# Patient Record
Sex: Female | Born: 1965 | Race: White | Hispanic: No | Marital: Single | Smoking: Never smoker
Health system: Southern US, Community
[De-identification: ages and names within clinical notes are randomized; demographics above are authoritative.]

## PROBLEM LIST (undated history)

## (undated) DIAGNOSIS — I10 Essential (primary) hypertension: Secondary | ICD-10-CM

## (undated) DIAGNOSIS — M810 Age-related osteoporosis without current pathological fracture: Secondary | ICD-10-CM

## (undated) DIAGNOSIS — N83209 Unspecified ovarian cyst, unspecified side: Secondary | ICD-10-CM

## (undated) DIAGNOSIS — F419 Anxiety disorder, unspecified: Secondary | ICD-10-CM

## (undated) DIAGNOSIS — G43909 Migraine, unspecified, not intractable, without status migrainosus: Secondary | ICD-10-CM

## (undated) DIAGNOSIS — F32A Depression, unspecified: Secondary | ICD-10-CM

## (undated) DIAGNOSIS — M1990 Unspecified osteoarthritis, unspecified site: Secondary | ICD-10-CM

## (undated) DIAGNOSIS — K449 Diaphragmatic hernia without obstruction or gangrene: Secondary | ICD-10-CM

## (undated) DIAGNOSIS — I340 Nonrheumatic mitral (valve) insufficiency: Secondary | ICD-10-CM

## (undated) DIAGNOSIS — R112 Nausea with vomiting, unspecified: Secondary | ICD-10-CM

## (undated) DIAGNOSIS — F329 Major depressive disorder, single episode, unspecified: Secondary | ICD-10-CM

## (undated) HISTORY — DX: Age-related osteoporosis without current pathological fracture: M81.0

## (undated) HISTORY — DX: Unspecified ovarian cyst, unspecified side: N83.209

## (undated) HISTORY — PX: COLONOSCOPY: SHX174

## (undated) HISTORY — PX: BUNIONECTOMY: SHX129

## (undated) HISTORY — DX: Anxiety disorder, unspecified: F41.9

## (undated) HISTORY — PX: KNEE ARTHROCENTESIS: SUR44

## (undated) HISTORY — DX: Nonrheumatic mitral (valve) insufficiency: I34.0

## (undated) HISTORY — PX: FOOT SURGERY: SHX648

## (undated) HISTORY — PX: ESOPHAGOGASTRODUODENOSCOPY: SHX1529

## (undated) HISTORY — DX: Diaphragmatic hernia without obstruction or gangrene: K44.9

---

## 1997-10-15 ENCOUNTER — Encounter (HOSPITAL_COMMUNITY): Admission: RE | Admit: 1997-10-15 | Discharge: 1998-01-13 | Payer: Self-pay | Admitting: *Deleted

## 2000-10-11 ENCOUNTER — Encounter: Admission: RE | Admit: 2000-10-11 | Discharge: 2000-10-11 | Payer: Self-pay | Admitting: Family Medicine

## 2000-10-11 ENCOUNTER — Encounter: Payer: Self-pay | Admitting: Family Medicine

## 2000-10-19 ENCOUNTER — Encounter: Payer: Self-pay | Admitting: Family Medicine

## 2000-10-19 ENCOUNTER — Encounter: Admission: RE | Admit: 2000-10-19 | Discharge: 2000-10-19 | Payer: Self-pay | Admitting: Family Medicine

## 2000-12-04 ENCOUNTER — Other Ambulatory Visit: Admission: RE | Admit: 2000-12-04 | Discharge: 2000-12-04 | Payer: Self-pay | Admitting: Family Medicine

## 2002-03-11 ENCOUNTER — Other Ambulatory Visit: Admission: RE | Admit: 2002-03-11 | Discharge: 2002-03-11 | Payer: Self-pay | Admitting: Family Medicine

## 2003-04-13 ENCOUNTER — Other Ambulatory Visit: Admission: RE | Admit: 2003-04-13 | Discharge: 2003-04-13 | Payer: Self-pay | Admitting: Family Medicine

## 2003-12-07 ENCOUNTER — Emergency Department (HOSPITAL_COMMUNITY): Admission: EM | Admit: 2003-12-07 | Discharge: 2003-12-07 | Payer: Self-pay | Admitting: Emergency Medicine

## 2004-09-13 ENCOUNTER — Other Ambulatory Visit: Admission: RE | Admit: 2004-09-13 | Discharge: 2004-09-13 | Payer: Self-pay | Admitting: Family Medicine

## 2006-03-02 ENCOUNTER — Emergency Department (HOSPITAL_COMMUNITY): Admission: EM | Admit: 2006-03-02 | Discharge: 2006-03-02 | Payer: Self-pay | Admitting: *Deleted

## 2006-03-07 ENCOUNTER — Encounter: Admission: RE | Admit: 2006-03-07 | Discharge: 2006-03-07 | Payer: Self-pay | Admitting: Family Medicine

## 2007-09-11 ENCOUNTER — Encounter: Admission: RE | Admit: 2007-09-11 | Discharge: 2007-09-11 | Payer: Self-pay | Admitting: Family Medicine

## 2008-02-25 ENCOUNTER — Other Ambulatory Visit: Admission: RE | Admit: 2008-02-25 | Discharge: 2008-02-25 | Payer: Self-pay | Admitting: Family Medicine

## 2008-10-01 ENCOUNTER — Encounter: Admission: RE | Admit: 2008-10-01 | Discharge: 2008-10-01 | Payer: Self-pay | Admitting: Family Medicine

## 2009-03-29 ENCOUNTER — Other Ambulatory Visit: Admission: RE | Admit: 2009-03-29 | Discharge: 2009-03-29 | Payer: Self-pay | Admitting: Family Medicine

## 2009-08-26 ENCOUNTER — Encounter: Admission: RE | Admit: 2009-08-26 | Discharge: 2009-08-26 | Payer: Self-pay | Admitting: Neurology

## 2009-10-05 ENCOUNTER — Encounter: Admission: RE | Admit: 2009-10-05 | Discharge: 2009-10-05 | Payer: Self-pay | Admitting: Family Medicine

## 2010-04-17 ENCOUNTER — Encounter: Payer: Self-pay | Admitting: Family Medicine

## 2010-04-27 ENCOUNTER — Other Ambulatory Visit: Payer: Self-pay | Admitting: Family Medicine

## 2010-04-27 ENCOUNTER — Other Ambulatory Visit (HOSPITAL_COMMUNITY)
Admission: RE | Admit: 2010-04-27 | Discharge: 2010-04-27 | Disposition: A | Discharge status: Home / Self Care | Payer: BC Managed Care – PPO | Source: Ambulatory Visit | Attending: Family Medicine | Admitting: Family Medicine

## 2010-04-27 DIAGNOSIS — Z124 Encounter for screening for malignant neoplasm of cervix: Secondary | ICD-10-CM | POA: Insufficient documentation

## 2010-09-09 ENCOUNTER — Other Ambulatory Visit: Payer: Self-pay | Admitting: Family Medicine

## 2010-09-09 DIAGNOSIS — Z1231 Encounter for screening mammogram for malignant neoplasm of breast: Secondary | ICD-10-CM

## 2010-10-13 ENCOUNTER — Ambulatory Visit
Admission: RE | Admit: 2010-10-13 | Discharge: 2010-10-13 | Disposition: A | Discharge status: Home / Self Care | Payer: BC Managed Care – PPO | Source: Ambulatory Visit | Attending: Family Medicine | Admitting: Family Medicine

## 2010-10-13 DIAGNOSIS — Z1231 Encounter for screening mammogram for malignant neoplasm of breast: Secondary | ICD-10-CM

## 2011-06-27 ENCOUNTER — Other Ambulatory Visit: Payer: Self-pay | Admitting: Family Medicine

## 2011-06-27 ENCOUNTER — Other Ambulatory Visit (HOSPITAL_COMMUNITY)
Admission: RE | Admit: 2011-06-27 | Discharge: 2011-06-27 | Disposition: A | Discharge status: Home / Self Care | Payer: BC Managed Care – PPO | Source: Ambulatory Visit | Attending: Family Medicine | Admitting: Family Medicine

## 2011-06-27 DIAGNOSIS — Z124 Encounter for screening for malignant neoplasm of cervix: Secondary | ICD-10-CM | POA: Insufficient documentation

## 2011-09-18 ENCOUNTER — Other Ambulatory Visit: Payer: Self-pay | Admitting: Family Medicine

## 2011-09-18 DIAGNOSIS — Z1231 Encounter for screening mammogram for malignant neoplasm of breast: Secondary | ICD-10-CM

## 2011-10-24 ENCOUNTER — Ambulatory Visit
Admission: RE | Admit: 2011-10-24 | Discharge: 2011-10-24 | Disposition: A | Discharge status: Home / Self Care | Payer: BC Managed Care – PPO | Source: Ambulatory Visit | Attending: Family Medicine | Admitting: Family Medicine

## 2011-10-24 DIAGNOSIS — Z1231 Encounter for screening mammogram for malignant neoplasm of breast: Secondary | ICD-10-CM

## 2011-10-25 ENCOUNTER — Other Ambulatory Visit: Payer: Self-pay | Admitting: Neurology

## 2011-10-25 DIAGNOSIS — R0989 Other specified symptoms and signs involving the circulatory and respiratory systems: Secondary | ICD-10-CM

## 2011-10-30 ENCOUNTER — Ambulatory Visit
Admission: RE | Admit: 2011-10-30 | Discharge: 2011-10-30 | Disposition: A | Discharge status: Home / Self Care | Payer: BC Managed Care – PPO | Source: Ambulatory Visit | Attending: Neurology | Admitting: Neurology

## 2011-10-30 DIAGNOSIS — R0989 Other specified symptoms and signs involving the circulatory and respiratory systems: Secondary | ICD-10-CM

## 2011-10-30 MED ORDER — GADOBENATE DIMEGLUMINE 529 MG/ML IV SOLN
12.0000 mL | Freq: Once | INTRAVENOUS | Status: AC | PRN
  Administered 2011-10-30: 12 mL via INTRAVENOUS

## 2011-10-30 MED ORDER — GADOBENATE DIMEGLUMINE 529 MG/ML IV SOLN
1.0000 mL | Freq: Once | INTRAVENOUS | Status: AC | PRN
  Administered 2011-10-30: 1 mL via INTRAVENOUS

## 2012-03-06 ENCOUNTER — Other Ambulatory Visit: Payer: Self-pay | Admitting: Dermatology

## 2012-07-11 ENCOUNTER — Other Ambulatory Visit (HOSPITAL_COMMUNITY)
Admission: RE | Admit: 2012-07-11 | Discharge: 2012-07-11 | Disposition: A | Discharge status: Home / Self Care | Payer: BC Managed Care – PPO | Source: Ambulatory Visit | Attending: Family Medicine | Admitting: Family Medicine

## 2012-07-11 ENCOUNTER — Other Ambulatory Visit: Payer: Self-pay | Admitting: Family Medicine

## 2012-07-11 DIAGNOSIS — Z124 Encounter for screening for malignant neoplasm of cervix: Secondary | ICD-10-CM | POA: Insufficient documentation

## 2012-09-20 ENCOUNTER — Other Ambulatory Visit: Payer: Self-pay

## 2012-09-20 DIAGNOSIS — Z1231 Encounter for screening mammogram for malignant neoplasm of breast: Secondary | ICD-10-CM

## 2012-10-28 ENCOUNTER — Ambulatory Visit
Admission: RE | Admit: 2012-10-28 | Discharge: 2012-10-28 | Disposition: A | Discharge status: Home / Self Care | Payer: BC Managed Care – PPO | Source: Ambulatory Visit

## 2012-10-28 DIAGNOSIS — Z1231 Encounter for screening mammogram for malignant neoplasm of breast: Secondary | ICD-10-CM

## 2013-09-15 ENCOUNTER — Other Ambulatory Visit (HOSPITAL_COMMUNITY)
Admission: RE | Admit: 2013-09-15 | Discharge: 2013-09-15 | Disposition: A | Discharge status: Home / Self Care | Payer: BC Managed Care – PPO | Source: Ambulatory Visit | Attending: Family Medicine | Admitting: Family Medicine

## 2013-09-15 ENCOUNTER — Other Ambulatory Visit: Payer: Self-pay | Admitting: Family Medicine

## 2013-09-15 DIAGNOSIS — Z124 Encounter for screening for malignant neoplasm of cervix: Secondary | ICD-10-CM | POA: Insufficient documentation

## 2013-09-16 LAB — CYTOLOGY - PAP

## 2013-09-22 ENCOUNTER — Other Ambulatory Visit: Payer: Self-pay

## 2013-09-22 DIAGNOSIS — Z1231 Encounter for screening mammogram for malignant neoplasm of breast: Secondary | ICD-10-CM

## 2013-10-29 ENCOUNTER — Ambulatory Visit
Admission: RE | Admit: 2013-10-29 | Discharge: 2013-10-29 | Disposition: A | Discharge status: Home / Self Care | Payer: BC Managed Care – PPO | Source: Ambulatory Visit

## 2013-10-29 DIAGNOSIS — Z1231 Encounter for screening mammogram for malignant neoplasm of breast: Secondary | ICD-10-CM

## 2014-04-07 ENCOUNTER — Other Ambulatory Visit: Payer: Self-pay | Admitting: Orthopedic Surgery

## 2014-05-12 ENCOUNTER — Encounter (HOSPITAL_BASED_OUTPATIENT_CLINIC_OR_DEPARTMENT_OTHER): Payer: Self-pay | Admitting: Emergency Medicine

## 2014-05-12 ENCOUNTER — Emergency Department (HOSPITAL_BASED_OUTPATIENT_CLINIC_OR_DEPARTMENT_OTHER)
Admission: EM | Admit: 2014-05-12 | Discharge: 2014-05-13 | Disposition: A | Discharge status: Home / Self Care | Payer: BC Managed Care – PPO | Attending: Emergency Medicine | Admitting: Emergency Medicine

## 2014-05-12 ENCOUNTER — Emergency Department (HOSPITAL_BASED_OUTPATIENT_CLINIC_OR_DEPARTMENT_OTHER): Payer: BC Managed Care – PPO

## 2014-05-12 DIAGNOSIS — S161XXA Strain of muscle, fascia and tendon at neck level, initial encounter: Secondary | ICD-10-CM | POA: Diagnosis not present

## 2014-05-12 DIAGNOSIS — F329 Major depressive disorder, single episode, unspecified: Secondary | ICD-10-CM | POA: Diagnosis not present

## 2014-05-12 DIAGNOSIS — S199XXA Unspecified injury of neck, initial encounter: Secondary | ICD-10-CM | POA: Diagnosis present

## 2014-05-12 DIAGNOSIS — G43909 Migraine, unspecified, not intractable, without status migrainosus: Secondary | ICD-10-CM | POA: Diagnosis not present

## 2014-05-12 DIAGNOSIS — Y92410 Unspecified street and highway as the place of occurrence of the external cause: Secondary | ICD-10-CM | POA: Diagnosis not present

## 2014-05-12 DIAGNOSIS — Z79899 Other long term (current) drug therapy: Secondary | ICD-10-CM | POA: Diagnosis not present

## 2014-05-12 DIAGNOSIS — Y998 Other external cause status: Secondary | ICD-10-CM | POA: Diagnosis not present

## 2014-05-12 DIAGNOSIS — Y9389 Activity, other specified: Secondary | ICD-10-CM | POA: Diagnosis not present

## 2014-05-12 DIAGNOSIS — I10 Essential (primary) hypertension: Secondary | ICD-10-CM | POA: Insufficient documentation

## 2014-05-12 DIAGNOSIS — M542 Cervicalgia: Secondary | ICD-10-CM

## 2014-05-12 HISTORY — DX: Migraine, unspecified, not intractable, without status migrainosus: G43.909

## 2014-05-12 HISTORY — DX: Depression, unspecified: F32.A

## 2014-05-12 HISTORY — DX: Major depressive disorder, single episode, unspecified: F32.9

## 2014-05-12 HISTORY — DX: Essential (primary) hypertension: I10

## 2014-05-12 MED ORDER — ACETAMINOPHEN 500 MG PO TABS
1000.0000 mg | ORAL_TABLET | Freq: Once | ORAL | Status: AC
  Administered 2014-05-13: 1000 mg via ORAL
  Filled 2014-05-12: qty 2

## 2014-05-12 MED ORDER — METHOCARBAMOL 500 MG PO TABS
1000.0000 mg | ORAL_TABLET | Freq: Once | ORAL | Status: AC
  Administered 2014-05-13: 1000 mg via ORAL
  Filled 2014-05-12: qty 2
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
## 2014-05-13 ENCOUNTER — Encounter (HOSPITAL_BASED_OUTPATIENT_CLINIC_OR_DEPARTMENT_OTHER): Payer: Self-pay | Admitting: Emergency Medicine

## 2014-05-13 DIAGNOSIS — S161XXA Strain of muscle, fascia and tendon at neck level, initial encounter: Secondary | ICD-10-CM | POA: Diagnosis not present

## 2014-05-13 MED ORDER — NAPROXEN 375 MG PO TABS: 375.0000 mg | ORAL_TABLET | Freq: Two times a day (BID) | ORAL | Status: DC

## 2014-05-13 MED ORDER — METHOCARBAMOL 500 MG PO TABS: 500.0000 mg | ORAL_TABLET | Freq: Two times a day (BID) | ORAL | Status: DC

## 2014-05-13 NOTE — Discharge Instructions (Signed)
Cervical Strain and Sprain (Whiplash) with Rehab Cervical strain and sprain are injuries that commonly occur with "whiplash" injuries. Whiplash occurs when the neck is forcefully whipped backward or forward, such as during a motor vehicle accident or during contact sports. The muscles, ligaments, tendons, discs, and nerves of the neck are susceptible to injury when this occurs. RISK FACTORS Risk of having a whiplash injury increases if:  Osteoarthritis of the spine.  Situations that make head or neck accidents or trauma more likely.  High-risk sports (football, rugby, wrestling, hockey, auto racing, gymnastics, diving, contact karate, or boxing).  Poor strength and flexibility of the neck.  Previous neck injury.  Poor tackling technique.  Improperly fitted or padded equipment. SYMPTOMS   Pain or stiffness in the front or back of neck or both.  Symptoms may present immediately or up to 24 hours after injury.  Dizziness, headache, nausea, and vomiting.  Muscle spasm with soreness and stiffness in the neck.  Tenderness and swelling at the injury site. PREVENTION  Learn and use proper technique (avoid tackling with the head, spearing, and head-butting; use proper falling techniques to avoid landing on the head).  Warm up and stretch properly before activity.  Maintain physical fitness:  Strength, flexibility, and endurance.  Cardiovascular fitness.  Wear properly fitted and padded protective equipment, such as padded soft collars, for participation in contact sports. PROGNOSIS  Recovery from cervical strain and sprain injuries is dependent on the extent of the injury. These injuries are usually curable in 1 week to 3 months with appropriate treatment.  RELATED COMPLICATIONS   Temporary numbness and weakness may occur if the nerve roots are damaged, and this may persist until the nerve has completely healed.  Chronic pain due to frequent recurrence of  symptoms.  Prolonged healing, especially if activity is resumed too soon (before complete recovery). TREATMENT  Treatment initially involves the use of ice and medication to help reduce pain and inflammation. It is also important to perform strengthening and stretching exercises and modify activities that worsen symptoms so the injury does not get worse. These exercises may be performed at home or with a therapist. For patients who experience severe symptoms, a soft, padded collar may be recommended to be worn around the neck.  Improving your posture may help reduce symptoms. Posture improvement includes pulling your chin and abdomen in while sitting or standing. If you are sitting, sit in a firm chair with your buttocks against the back of the chair. While sleeping, try replacing your pillow with a small towel rolled to 2 inches in diameter, or use a cervical pillow or soft cervical collar. Poor sleeping positions delay healing.  For patients with nerve root damage, which causes numbness or weakness, the use of a cervical traction apparatus may be recommended. Surgery is rarely necessary for these injuries. However, cervical strain and sprains that are present at birth (congenital) may require surgery. MEDICATION   If pain medication is necessary, nonsteroidal anti-inflammatory medications, such as aspirin and ibuprofen, or other minor pain relievers, such as acetaminophen, are often recommended.  Do not take pain medication for 7 days before surgery.  Prescription pain relievers may be given if deemed necessary by your caregiver. Use only as directed and only as much as you need. HEAT AND COLD:   Cold treatment (icing) relieves pain and reduces inflammation. Cold treatment should be applied for 10 to 15 minutes every 2 to 3 hours for inflammation and pain and immediately after any activity that aggravates   your symptoms. Use ice packs or an ice massage.  Heat treatment may be used prior to  performing the stretching and strengthening activities prescribed by your caregiver, physical therapist, or athletic trainer. Use a heat pack or a warm soak. SEEK MEDICAL CARE IF:   Symptoms get worse or do not improve in 2 weeks despite treatment.  New, unexplained symptoms develop (drugs used in treatment may produce side effects). EXERCISES RANGE OF MOTION (ROM) AND STRETCHING EXERCISES - Cervical Strain and Sprain These exercises may help you when beginning to rehabilitate your injury. In order to successfully resolve your symptoms, you must improve your posture. These exercises are designed to help reduce the forward-head and rounded-shoulder posture which contributes to this condition. Your symptoms may resolve with or without further involvement from your physician, physical therapist or athletic trainer. While completing these exercises, remember:   Restoring tissue flexibility helps normal motion to return to the joints. This allows healthier, less painful movement and activity.  An effective stretch should be held for at least 20 seconds, although you may need to begin with shorter hold times for comfort.  A stretch should never be painful. You should only feel a gentle lengthening or release in the stretched tissue. STRETCH- Axial Extensors  Lie on your back on the floor. You may bend your knees for comfort. Place a rolled-up hand towel or dish towel, about 2 inches in diameter, under the part of your head that makes contact with the floor.  Gently tuck your chin, as if trying to make a "double chin," until you feel a gentle stretch at the base of your head.  Hold __________ seconds. Repeat __________ times. Complete this exercise __________ times per day.  STRETCH - Axial Extension   Stand or sit on a firm surface. Assume a good posture: chest up, shoulders drawn back, abdominal muscles slightly tense, knees unlocked (if standing) and feet hip width apart.  Slowly retract your  chin so your head slides back and your chin slightly lowers. Continue to look straight ahead.  You should feel a gentle stretch in the back of your head. Be certain not to feel an aggressive stretch since this can cause headaches later.  Hold for __________ seconds. Repeat __________ times. Complete this exercise __________ times per day. STRETCH - Cervical Side Bend   Stand or sit on a firm surface. Assume a good posture: chest up, shoulders drawn back, abdominal muscles slightly tense, knees unlocked (if standing) and feet hip width apart.  Without letting your nose or shoulders move, slowly tip your right / left ear to your shoulder until your feel a gentle stretch in the muscles on the opposite side of your neck.  Hold __________ seconds. Repeat __________ times. Complete this exercise __________ times per day. STRETCH - Cervical Rotators   Stand or sit on a firm surface. Assume a good posture: chest up, shoulders drawn back, abdominal muscles slightly tense, knees unlocked (if standing) and feet hip width apart.  Keeping your eyes level with the ground, slowly turn your head until you feel a gentle stretch along the back and opposite side of your neck.  Hold __________ seconds. Repeat __________ times. Complete this exercise __________ times per day. RANGE OF MOTION - Neck Circles   Stand or sit on a firm surface. Assume a good posture: chest up, shoulders drawn back, abdominal muscles slightly tense, knees unlocked (if standing) and feet hip width apart.  Gently roll your head down and around from the   back of one shoulder to the back of the other. The motion should never be forced or painful.  Repeat the motion 10-20 times, or until you feel the neck muscles relax and loosen. Repeat __________ times. Complete the exercise __________ times per day. STRENGTHENING EXERCISES - Cervical Strain and Sprain These exercises may help you when beginning to rehabilitate your injury. They may  resolve your symptoms with or without further involvement from your physician, physical therapist, or athletic trainer. While completing these exercises, remember:   Muscles can gain both the endurance and the strength needed for everyday activities through controlled exercises.  Complete these exercises as instructed by your physician, physical therapist, or athletic trainer. Progress the resistance and repetitions only as guided.  You may experience muscle soreness or fatigue, but the pain or discomfort you are trying to eliminate should never worsen during these exercises. If this pain does worsen, stop and make certain you are following the directions exactly. If the pain is still present after adjustments, discontinue the exercise until you can discuss the trouble with your clinician. STRENGTH - Cervical Flexors, Isometric  Face a wall, standing about 6 inches away. Place a small pillow, a ball about 6-8 inches in diameter, or a folded towel between your forehead and the wall.  Slightly tuck your chin and gently push your forehead into the soft object. Push only with mild to moderate intensity, building up tension gradually. Keep your jaw and forehead relaxed.  Hold 10 to 20 seconds. Keep your breathing relaxed.  Release the tension slowly. Relax your neck muscles completely before you start the next repetition. Repeat __________ times. Complete this exercise __________ times per day. STRENGTH- Cervical Lateral Flexors, Isometric   Stand about 6 inches away from a wall. Place a small pillow, a ball about 6-8 inches in diameter, or a folded towel between the side of your head and the wall.  Slightly tuck your chin and gently tilt your head into the soft object. Push only with mild to moderate intensity, building up tension gradually. Keep your jaw and forehead relaxed.  Hold 10 to 20 seconds. Keep your breathing relaxed.  Release the tension slowly. Relax your neck muscles completely  before you start the next repetition. Repeat __________ times. Complete this exercise __________ times per day. STRENGTH - Cervical Extensors, Isometric   Stand about 6 inches away from a wall. Place a small pillow, a ball about 6-8 inches in diameter, or a folded towel between the back of your head and the wall.  Slightly tuck your chin and gently tilt your head back into the soft object. Push only with mild to moderate intensity, building up tension gradually. Keep your jaw and forehead relaxed.  Hold 10 to 20 seconds. Keep your breathing relaxed.  Release the tension slowly. Relax your neck muscles completely before you start the next repetition. Repeat __________ times. Complete this exercise __________ times per day. POSTURE AND BODY MECHANICS CONSIDERATIONS - Cervical Strain and Sprain Keeping correct posture when sitting, standing or completing your activities will reduce the stress put on different body tissues, allowing injured tissues a chance to heal and limiting painful experiences. The following are general guidelines for improved posture. Your physician or physical therapist will provide you with any instructions specific to your needs. While reading these guidelines, remember:  The exercises prescribed by your provider will help you have the flexibility and strength to maintain correct postures.  The correct posture provides the optimal environment for your joints to   work. All of your joints have less wear and tear when properly supported by a spine with good posture. This means you will experience a healthier, less painful body.  Correct posture must be practiced with all of your activities, especially prolonged sitting and standing. Correct posture is as important when doing repetitive low-stress activities (typing) as it is when doing a single heavy-load activity (lifting). PROLONGED STANDING WHILE SLIGHTLY LEANING FORWARD When completing a task that requires you to lean  forward while standing in one place for a long time, place either foot up on a stationary 2- to 4-inch high object to help maintain the best posture. When both feet are on the ground, the low back tends to lose its slight inward curve. If this curve flattens (or becomes too large), then the back and your other joints will experience too much stress, fatigue more quickly, and can cause pain.  RESTING POSITIONS Consider which positions are most painful for you when choosing a resting position. If you have pain with flexion-based activities (sitting, bending, stooping, squatting), choose a position that allows you to rest in a less flexed posture. You would want to avoid curling into a fetal position on your side. If your pain worsens with extension-based activities (prolonged standing, working overhead), avoid resting in an extended position such as sleeping on your stomach. Most people will find more comfort when they rest with their spine in a more neutral position, neither too rounded nor too arched. Lying on a non-sagging bed on your side with a pillow between your knees, or on your back with a pillow under your knees will often provide some relief. Keep in mind, being in any one position for a prolonged period of time, no matter how correct your posture, can still lead to stiffness. WALKING Walk with an upright posture. Your ears, shoulders, and hips should all line up. OFFICE WORK When working at a desk, create an environment that supports good, upright posture. Without extra support, muscles fatigue and lead to excessive strain on joints and other tissues. CHAIR:  A chair should be able to slide under your desk when your back makes contact with the back of the chair. This allows you to work closely.  The chair's height should allow your eyes to be level with the upper part of your monitor and your hands to be slightly lower than your elbows.  Body position:  Your feet should make contact with the  floor. If this is not possible, use a foot rest.  Keep your ears over your shoulders. This will reduce stress on your neck and low back. Document Released: 03/13/2005 Document Revised: 07/28/2013 Document Reviewed: 06/25/2008 [employer] Patient Information 2015 [employer]. This information is not intended to replace advice given to you by your health care provider. Make sure you discuss any questions you have with your health care provider.  

## 2014-10-01 ENCOUNTER — Other Ambulatory Visit: Payer: Self-pay

## 2014-10-01 DIAGNOSIS — Z1231 Encounter for screening mammogram for malignant neoplasm of breast: Secondary | ICD-10-CM

## 2014-10-15 ENCOUNTER — Other Ambulatory Visit: Payer: Self-pay | Admitting: Obstetrics & Gynecology

## 2014-11-02 ENCOUNTER — Ambulatory Visit
Admission: RE | Admit: 2014-11-02 | Discharge: 2014-11-02 | Disposition: A | Discharge status: Home / Self Care | Payer: BC Managed Care – PPO | Source: Ambulatory Visit

## 2014-11-02 DIAGNOSIS — Z1231 Encounter for screening mammogram for malignant neoplasm of breast: Secondary | ICD-10-CM

## 2015-10-04 ENCOUNTER — Other Ambulatory Visit: Payer: Self-pay | Admitting: Family Medicine

## 2015-10-04 DIAGNOSIS — Z1231 Encounter for screening mammogram for malignant neoplasm of breast: Secondary | ICD-10-CM

## 2015-11-04 ENCOUNTER — Ambulatory Visit
Admission: RE | Admit: 2015-11-04 | Discharge: 2015-11-04 | Disposition: A | Discharge status: Home / Self Care | Payer: BC Managed Care – PPO | Source: Ambulatory Visit | Attending: Family Medicine | Admitting: Family Medicine

## 2015-11-04 DIAGNOSIS — Z1231 Encounter for screening mammogram for malignant neoplasm of breast: Secondary | ICD-10-CM

## 2016-07-10 ENCOUNTER — Other Ambulatory Visit: Payer: Self-pay | Admitting: Neurology

## 2016-07-10 DIAGNOSIS — Z09 Encounter for follow-up examination after completed treatment for conditions other than malignant neoplasm: Secondary | ICD-10-CM

## 2016-07-21 ENCOUNTER — Ambulatory Visit
Admission: RE | Admit: 2016-07-21 | Discharge: 2016-07-21 | Disposition: A | Discharge status: Home / Self Care | Payer: BC Managed Care – PPO | Source: Ambulatory Visit | Attending: Neurology | Admitting: Neurology

## 2016-07-21 DIAGNOSIS — Z09 Encounter for follow-up examination after completed treatment for conditions other than malignant neoplasm: Secondary | ICD-10-CM

## 2016-07-21 MED ORDER — GADOBENATE DIMEGLUMINE 529 MG/ML IV SOLN
15.0000 mL | Freq: Once | INTRAVENOUS | Status: AC | PRN
  Administered 2016-07-21: 14 mL via INTRAVENOUS

## 2016-08-01 ENCOUNTER — Other Ambulatory Visit: Payer: Self-pay | Admitting: Neurology

## 2016-08-01 DIAGNOSIS — I773 Arterial fibromuscular dysplasia: Secondary | ICD-10-CM

## 2016-08-09 ENCOUNTER — Ambulatory Visit
Admission: RE | Admit: 2016-08-09 | Discharge: 2016-08-09 | Disposition: A | Discharge status: Home / Self Care | Payer: BC Managed Care – PPO | Source: Ambulatory Visit | Attending: Neurology | Admitting: Neurology

## 2016-08-09 DIAGNOSIS — I773 Arterial fibromuscular dysplasia: Secondary | ICD-10-CM

## 2016-09-22 ENCOUNTER — Other Ambulatory Visit: Payer: Self-pay | Admitting: Family Medicine

## 2016-09-22 ENCOUNTER — Other Ambulatory Visit: Payer: Self-pay | Admitting: Internal Medicine

## 2016-09-22 DIAGNOSIS — Z1231 Encounter for screening mammogram for malignant neoplasm of breast: Secondary | ICD-10-CM

## 2016-11-06 ENCOUNTER — Ambulatory Visit
Admission: RE | Admit: 2016-11-06 | Discharge: 2016-11-06 | Disposition: A | Discharge status: Home / Self Care | Payer: BC Managed Care – PPO | Source: Ambulatory Visit | Attending: Internal Medicine | Admitting: Internal Medicine

## 2016-11-06 DIAGNOSIS — Z1231 Encounter for screening mammogram for malignant neoplasm of breast: Secondary | ICD-10-CM

## 2017-09-24 ENCOUNTER — Other Ambulatory Visit: Payer: Self-pay | Admitting: Internal Medicine

## 2017-09-24 DIAGNOSIS — Z1231 Encounter for screening mammogram for malignant neoplasm of breast: Secondary | ICD-10-CM

## 2017-11-09 ENCOUNTER — Ambulatory Visit
Admission: RE | Admit: 2017-11-09 | Discharge: 2017-11-09 | Disposition: A | Discharge status: Home / Self Care | Payer: BC Managed Care – PPO | Source: Ambulatory Visit | Attending: Internal Medicine | Admitting: Internal Medicine

## 2017-11-09 DIAGNOSIS — Z1231 Encounter for screening mammogram for malignant neoplasm of breast: Secondary | ICD-10-CM

## 2018-10-28 ENCOUNTER — Other Ambulatory Visit: Payer: Self-pay | Admitting: Internal Medicine

## 2018-10-28 DIAGNOSIS — Z1231 Encounter for screening mammogram for malignant neoplasm of breast: Secondary | ICD-10-CM

## 2018-12-11 ENCOUNTER — Ambulatory Visit
Admission: RE | Admit: 2018-12-11 | Discharge: 2018-12-11 | Disposition: A | Discharge status: Home / Self Care | Payer: BC Managed Care – PPO | Source: Ambulatory Visit | Attending: Internal Medicine | Admitting: Internal Medicine

## 2018-12-11 ENCOUNTER — Other Ambulatory Visit: Payer: Self-pay

## 2018-12-11 DIAGNOSIS — Z1231 Encounter for screening mammogram for malignant neoplasm of breast: Secondary | ICD-10-CM

## 2019-12-16 ENCOUNTER — Other Ambulatory Visit: Payer: Self-pay | Admitting: Internal Medicine

## 2019-12-16 DIAGNOSIS — R011 Cardiac murmur, unspecified: Secondary | ICD-10-CM

## 2019-12-19 ENCOUNTER — Ambulatory Visit: Payer: BC Managed Care – PPO

## 2019-12-19 ENCOUNTER — Other Ambulatory Visit: Payer: Self-pay

## 2019-12-19 DIAGNOSIS — R011 Cardiac murmur, unspecified: Secondary | ICD-10-CM

## 2019-12-24 ENCOUNTER — Other Ambulatory Visit: Payer: Self-pay | Admitting: Internal Medicine

## 2019-12-24 DIAGNOSIS — Z1231 Encounter for screening mammogram for malignant neoplasm of breast: Secondary | ICD-10-CM

## 2020-01-09 ENCOUNTER — Ambulatory Visit: Payer: BC Managed Care – PPO | Admitting: Cardiology

## 2020-01-09 ENCOUNTER — Other Ambulatory Visit: Payer: Self-pay

## 2020-01-09 ENCOUNTER — Encounter: Payer: Self-pay | Admitting: Cardiology

## 2020-01-09 VITALS — BP 167/94 | HR 88 | Ht 66.0 in | Wt 150.0 lb

## 2020-01-09 DIAGNOSIS — I340 Nonrheumatic mitral (valve) insufficiency: Secondary | ICD-10-CM

## 2020-01-09 DIAGNOSIS — R072 Precordial pain: Secondary | ICD-10-CM

## 2020-01-09 DIAGNOSIS — I10 Essential (primary) hypertension: Secondary | ICD-10-CM

## 2020-01-09 DIAGNOSIS — Z712 Person consulting for explanation of examination or test findings: Secondary | ICD-10-CM

## 2020-01-09 NOTE — Patient Instructions (Signed)
Please hold diltiazem 2 days prior to the stress test.

## 2020-01-09 NOTE — Progress Notes (Signed)
Date:  01/09/2020   ID:  Cynthia Roberts, DOB [birth year], MRN [id number]  PCP:  Crist Infante, MD  Cardiologist:  Rex Kras, DO, [facility] (established care 01/09/2020)  REASON FOR CONSULT: Mitral regurgitation and chest pain  REQUESTING PHYSICIAN:  Crist Infante, MD [address] [place],  [place] [address]  Chief Complaint  Patient presents with  . Mitral Regurgitation  . New Patient (Initial Visit)  . Chest Pain    HPI  Cynthia Roberts is a 54 y.o. female who presents to the office with a chief complaint of " evaluation of mitral regurgitation and chest pain." Patient's past medical history and cardiovascular risk factors include: hypertension, depression, nonrheumatic mitral regurgitation.   She is referred to the office at the request of Crist Infante, MD for evaluation of mitral regurgitation and chest pain.  Patient states that she recently went for her yearly physical and was noted to have a cardiac murmur on examination.  She was asked to have an echocardiogram which noted mitral regurgitation history she is referred to the office for further evaluation and management.  Patient states that she is also been having chest pain recently.  Her last episode was yesterday.  Symptoms have been present for the last 1 month.  She describes the discomfort as tightness-like sensation in the substernal region, intensity is 5 out of 10, does not get worse with exercise or with activity and at times does get better with resting.  No improving or worsening factors.  The symptoms are usually self-limited.  She has not taken any aspirin or sublingual nitroglycerin tablets for the discomfort.  Patient states that she has been having dyspnea on exertion which is chronic and stable  No family history of premature coronary disease or sudden cardiac death.  Patient states that her home blood pressures usually range between 021-115 mmHg and diastolic blood pressures range between 70-80 mmHg.    Denies prior history of coronary artery disease, myocardial infarction, congestive heart failure, deep venous thrombosis, pulmonary embolism, stroke, transient ischemic attack.  ALLERGIES: No Known Allergies  MEDICATION LIST PRIOR TO VISIT: Current Meds  Medication Sig  . brexpiprazole (REXULTI) 1 MG TABS tablet Take 1 tablet by mouth daily.  Marland Kitchen diltiazem (TIAZAC) 240 MG 24 hr capsule Take 240 mg by mouth daily.   Marland Kitchen doxazosin (CARDURA) 2 MG tablet Take 1 tablet by mouth daily.  . irbesartan (AVAPRO) 300 MG tablet Take 1 tablet by mouth daily.  Marland Kitchen L-Methylfolate (DEPLIN) 7.5 MG TABS Take 15 mg by mouth daily.   Marland Kitchen lamoTRIgine (LAMICTAL) 150 MG tablet Take 150 mg by mouth 2 (two) times daily.   Marland Kitchen linaclotide (LINZESS) 290 MCG CAPS capsule 1 capsule as needed.  . promethazine (PHENERGAN) 25 MG tablet as needed.  . temazepam (RESTORIL) 30 MG capsule Take 30 mg by mouth at bedtime as needed for sleep.   . traZODone (DESYREL) 150 MG tablet Take by mouth at bedtime.  Marland Kitchen Ubrogepant (UBRELVY) 50 MG TABS 1 tablet as needed.  . vortioxetine HBr (TRINTELLIX) 20 MG TABS tablet Take 1 tablet by mouth daily.  Marland Kitchen zonisamide (ZONEGRAN) 100 MG capsule Take 200 mg by mouth daily.     PAST MEDICAL HISTORY: Past Medical History:  Diagnosis Date  . Depression   . Hypertension   . Migraines     PAST SURGICAL HISTORY: Past Surgical History:  Procedure Laterality Date  . FOOT SURGERY Left   . KNEE ARTHROCENTESIS Left     FAMILY HISTORY: The  patient family history includes Heart disease in her mother; Stroke in her father.  SOCIAL HISTORY:  The patient  reports that she has never smoked. She has never used smokeless tobacco. She reports that she does not drink alcohol and does not use drugs.  REVIEW OF SYSTEMS: Review of Systems  Constitutional: Negative for chills and fever.  HENT: Negative for hoarse voice and nosebleeds.   Eyes: Negative for discharge, double vision and pain.   Cardiovascular: Positive for chest pain and dyspnea on exertion. Negative for claudication, leg swelling, orthopnea, palpitations, paroxysmal nocturnal dyspnea and syncope.  Respiratory: Negative for hemoptysis and shortness of breath.   Musculoskeletal: Negative for muscle cramps and myalgias.  Gastrointestinal: Negative for abdominal pain, constipation, diarrhea, hematemesis, hematochezia, melena, nausea and vomiting.  Neurological: Negative for dizziness and light-headedness.    PHYSICAL EXAM: Vitals with BMI 01/09/2020 01/09/2020 05/13/2014  Height - _0  -  Weight - 150 lbs -  BMI - 89.21 -  Systolic 194 174 081  Diastolic 94 89 64  Pulse 88 92 78   CONSTITUTIONAL: Well-developed and well-nourished. No acute distress.  SKIN: Skin is warm and dry. No rash noted. No cyanosis. No pallor. No jaundice HEAD: Normocephalic and atraumatic.  EYES: No scleral icterus MOUTH/THROAT: Moist oral membranes.  NECK: No JVD present. No thyromegaly noted. No carotid bruits  LYMPHATIC: No visible cervical adenopathy.  CHEST Normal respiratory effort. No intercostal retractions  LUNGS: Clear to auscultation bilaterally.  No stridor. No wheezes. No rales.  CARDIOVASCULAR: Regular rate and rhythm, positive K4-Y1, soft holosystolic murmur heard at the apex, no gallops or rubs ABDOMINAL: No apparent ascites.  EXTREMITIES: No peripheral edema, warm to touch bilaterally.  2+ dorsalis pedis and posterior tibial pulses. HEMATOLOGIC: No significant bruising NEUROLOGIC: Oriented to person, place, and time. Nonfocal. Normal muscle tone.  PSYCHIATRIC: Normal mood and affect. Normal behavior. Cooperative  CARDIAC DATABASE: EKG: 01/09/2020: Normal sinus rhythm, 88 bpm, left axis deviation, poor R wave progression, LVH per voltage criteria, nonspecific T wave abnormality, without underlying injury pattern.  Echocardiogram:  12/19/2019: Left ventricle cavity is normal in size and wall thickness. Normal global  wall motion. Normal LV systolic function with EF 64%. Diastolic function not assessed due to severity of mitral regurgitation.  Calculated EF 64%. Left atrial cavity is mildly dilated. Eccentric, moderate (Grade III) mitral regurgitation. Moderate tricuspid regurgitation. Estimated pulmonary artery systolic pressure 35 mmHg.   Stress Testing: No results found for this or any previous visit from the past 1095 days.  Heart Catheterization: None  LABORATORY DATA: External Labs: Collected: 12/05/2019 Creatinine 0.9 mg/dL. eGFR: 66 mL/min per 1.73 m Lipid profile: Total cholesterol 166, triglycerides 98, HDL 61, LDL 85, non HDL 105 Hemoglobin A1c: 4.9 TSH: 1.49   IMPRESSION:    ICD-10-CM   1. Nonrheumatic mitral valve regurgitation  I34.0 EKG 12-Lead    PCV ECHOCARDIOGRAM COMPLETE  2. Precordial chest pain  R07.2 PCV MYOCARDIAL PERFUSION WO LEXISCAN    SARS-COV-2 RNA,(COVID-19) QUAL NAAT  3. Benign hypertension  I10   4. Encounter to discuss test results  Z71.2      RECOMMENDATIONS: SHAWNTAY PREST is a 54 y.o. female whose past medical history and cardiac risk factors include: hypertension, depression, nonrheumatic mitral regurgitation.   Nonrheumatic mitral valve regurgitation:  Patient has no prior history of rheumatic fever.  Recent echocardiogram reported a markedly grade 3 moderate.  However, I personally reviewed the echocardiogram that was performed in the MR severity appears to be mild to  moderate.  We will repeat the echo in 6 months to reevaluate the severity.  I also educated the patient that improving her systolic blood pressures can help improve the severity of her mitral regurgitation and prolonged need for mitral valve intervention in the future.  Patient stated that she will discuss blood pressure medication titration with her PCP.  Precordial chest pain  Patient symptoms of chest pain have both typical and atypical features.  EKG shows normal sinus rhythm  without underlying ischemia or injury pattern.  Echocardiogram results reviewed with her in great detail at today's visit.  Recommend exercise nuclear stress test given LVH on EKG.  Patient is asked to hold calcium channel blockers 2 days prior to stress test.  She will require Covid screen prior to the study  Benign essential hypertension:  Medications reconciled.  Patient is office blood pressures are not at goal.  And even at home her systolic blood pressures are between 140-150 mm).  I have asked her to keep a log of her blood pressures and to review with her PCP to see if clinically her antihypertensive medications need to be uptitrated.  Patient states that she will follow-up with a PCP in this regard.  In the meantime educated on the importance of low-salt diet.  FINAL MEDICATION LIST END OF ENCOUNTER: No orders of the defined types were placed in this encounter.     Current Outpatient Medications:  .  brexpiprazole (REXULTI) 1 MG TABS tablet, Take 1 tablet by mouth daily., Disp: , Rfl:  .  diltiazem (TIAZAC) 240 MG 24 hr capsule, Take 240 mg by mouth daily. , Disp: , Rfl:  .  doxazosin (CARDURA) 2 MG tablet, Take 1 tablet by mouth daily., Disp: , Rfl:  .  irbesartan (AVAPRO) 300 MG tablet, Take 1 tablet by mouth daily., Disp: , Rfl:  .  L-Methylfolate (DEPLIN) 7.5 MG TABS, Take 15 mg by mouth daily. , Disp: , Rfl:  .  lamoTRIgine (LAMICTAL) 150 MG tablet, Take 150 mg by mouth 2 (two) times daily. , Disp: , Rfl:  .  linaclotide (LINZESS) 290 MCG CAPS capsule, 1 capsule as needed., Disp: , Rfl:  .  promethazine (PHENERGAN) 25 MG tablet, as needed., Disp: , Rfl:  .  temazepam (RESTORIL) 30 MG capsule, Take 30 mg by mouth at bedtime as needed for sleep. , Disp: , Rfl:  .  traZODone (DESYREL) 150 MG tablet, Take by mouth at bedtime., Disp: , Rfl:  .  Ubrogepant (UBRELVY) 50 MG TABS, 1 tablet as needed., Disp: , Rfl:  .  vortioxetine HBr (TRINTELLIX) 20 MG TABS tablet, Take 1  tablet by mouth daily., Disp: , Rfl:  .  zonisamide (ZONEGRAN) 100 MG capsule, Take 200 mg by mouth daily., Disp: , Rfl:   Orders Placed This Encounter  Procedures  . SARS-COV-2 RNA,(COVID-19) QUAL NAAT  . PCV MYOCARDIAL PERFUSION WO LEXISCAN  . EKG 12-Lead  . PCV ECHOCARDIOGRAM COMPLETE    Patient Instructions  Please hold diltiazem 2 days prior to the stress test.   --Continue cardiac medications as reconciled in final medication list. --Return in about 4 weeks (around 02/06/2020) for Reevaluation of chest pain and , Review test results. Or sooner if needed. --Continue follow-up with your primary care physician regarding the management of your other chronic comorbid conditions.  Patient's questions and concerns were addressed to her satisfaction. She voices understanding of the instructions provided during this encounter.   This note was created using a voice recognition software  as a result there may be grammatical errors inadvertently enclosed that do not reflect the nature of this encounter. Every attempt is made to correct such errors.  Rex Kras, [place], [facility]  Pager: [phone] Office: [phone]

## 2020-01-12 ENCOUNTER — Ambulatory Visit: Payer: BC Managed Care – PPO

## 2020-01-12 ENCOUNTER — Other Ambulatory Visit (HOSPITAL_COMMUNITY)
Admission: RE | Admit: 2020-01-12 | Discharge: 2020-01-12 | Disposition: A | Discharge status: Home / Self Care | Payer: BC Managed Care – PPO | Source: Ambulatory Visit | Attending: Cardiology | Admitting: Cardiology

## 2020-01-12 DIAGNOSIS — Z20822 Contact with and (suspected) exposure to covid-19: Secondary | ICD-10-CM | POA: Diagnosis not present

## 2020-01-12 DIAGNOSIS — Z01812 Encounter for preprocedural laboratory examination: Secondary | ICD-10-CM | POA: Insufficient documentation

## 2020-01-12 LAB — SARS CORONAVIRUS 2 (TAT 6-24 HRS): SARS Coronavirus 2: NEGATIVE

## 2020-01-14 ENCOUNTER — Ambulatory Visit: Payer: BC Managed Care – PPO | Admitting: Cardiology

## 2020-01-14 ENCOUNTER — Other Ambulatory Visit: Payer: Self-pay

## 2020-01-14 ENCOUNTER — Ambulatory Visit: Payer: BC Managed Care – PPO

## 2020-01-14 DIAGNOSIS — R072 Precordial pain: Secondary | ICD-10-CM

## 2020-01-15 ENCOUNTER — Ambulatory Visit: Payer: BC Managed Care – PPO

## 2020-01-15 DIAGNOSIS — I340 Nonrheumatic mitral (valve) insufficiency: Secondary | ICD-10-CM

## 2020-01-19 ENCOUNTER — Telehealth: Payer: Self-pay

## 2020-01-19 NOTE — Progress Notes (Signed)
No answer, left a vm will try again later

## 2020-01-19 NOTE — Telephone Encounter (Signed)
-----   Message from [place], [place] sent at 01/18/2020  1:55 PM EDT ----- The nuclear stress test that was recently performed was reported as  low risk study.  The other details of the report will be discussed at the next office visit.

## 2020-01-19 NOTE — Telephone Encounter (Signed)
Relayed information to pt regarding test results. Pt voiced understanding.

## 2020-02-02 ENCOUNTER — Ambulatory Visit: Payer: BC Managed Care – PPO | Admitting: Cardiology

## 2020-02-02 ENCOUNTER — Other Ambulatory Visit: Payer: Self-pay

## 2020-02-02 ENCOUNTER — Encounter: Payer: Self-pay | Admitting: Cardiology

## 2020-02-02 VITALS — BP 140/80 | HR 90 | Ht 66.0 in | Wt 150.0 lb

## 2020-02-02 DIAGNOSIS — Z712 Person consulting for explanation of examination or test findings: Secondary | ICD-10-CM

## 2020-02-02 DIAGNOSIS — I340 Nonrheumatic mitral (valve) insufficiency: Secondary | ICD-10-CM

## 2020-02-02 DIAGNOSIS — R072 Precordial pain: Secondary | ICD-10-CM

## 2020-02-02 DIAGNOSIS — I10 Essential (primary) hypertension: Secondary | ICD-10-CM

## 2020-02-02 NOTE — Progress Notes (Signed)
Date:  02/02/2020   ID:  Cynthia Roberts, DOB 07/07/1965, MRN [id number]  PCP:  Cynthia Infante, MD  Cardiologist:  Rex Kras, DO, [facility] (established care 01/09/2020)  Date: 02/02/20 Last Office Visit: 01/09/2020  Chief Complaint  Patient presents with  . Mitral Regurgitation    test results  . Follow-up    HPI  Cynthia Roberts is a 54 y.o. female who presents to the office with a chief complaint of " reevaluation of chest pain and review test results." Patient's past medical history and cardiovascular risk factors include: hypertension, depression, nonrheumatic mitral regurgitation.   She is referred to the office at the request of Cynthia Infante, MD for evaluation of mitral regurgitation and chest pain.  Patient states that she recently went for her yearly physical and was noted to have a cardiac murmur on examination.  She was asked to have an echocardiogram which noted mitral regurgitation history she is referred to the office for further evaluation and management.  Since last office visit patient states that she continues to have chest discomfort intermittently, located over the left anterior chest wall, intensity has improved to 2 out of 10, pressure-like sensation, not brought on by effort related activities and does not resolve with rest.  The pain is usually self-limited.  She underwent an echocardiogram which noted preserved LVEF and stress test was reported to be overall low risk study.  Since last office visit she had a repeat echocardiogram which notes that the mitral regurgitation is mild in intensity as opposed to moderate.  This is most likely secondary to improvement in her systolic blood pressures since the addition of antihypertensive medications.  We will follow the mitral regurgitation longitudinally.  No further work-up needed at this time.  Review of systems are positive for feeling dizzy.  No prior history of stroke or diagnosis of vertigo.  Not brought on by head  turning, no discomfort with performing overhead activities, and no episodes of near syncope or syncope.  She does have migraines and are sometimes associated with this.  Patient systolic blood pressure has improved since last office visit.  She was recently started on chlorthalidone by her PCP which she takes every other day.  Patient states that her home blood pressures are usually 130/80.  Patient states that she had a significant amount of stress related to work and therefore will be off work until March 2022.  No family history of premature coronary disease or sudden cardiac death.  Patient states that her home blood pressures usually range between 262-035 mmHg and diastolic blood pressures range between 70-80 mmHg.    ALLERGIES: No Known Allergies  MEDICATION LIST PRIOR TO VISIT: Current Meds  Medication Sig  . brexpiprazole (REXULTI) 1 MG TABS tablet Take 1 tablet by mouth daily.  . chlorthalidone (HYGROTON) 25 MG tablet Take 25 mg by mouth every other day.  . diltiazem (TIAZAC) 240 MG 24 hr capsule Take 240 mg by mouth daily.   Marland Kitchen doxazosin (CARDURA) 2 MG tablet Take 1 tablet by mouth daily.  . irbesartan (AVAPRO) 300 MG tablet Take 1 tablet by mouth daily.  Marland Kitchen L-Methylfolate (DEPLIN) 7.5 MG TABS Take 15 mg by mouth daily.   Marland Kitchen lamoTRIgine (LAMICTAL) 150 MG tablet Take 150 mg by mouth 2 (two) times daily.   Marland Kitchen linaclotide (LINZESS) 290 MCG CAPS capsule 1 capsule as needed.  . temazepam (RESTORIL) 30 MG capsule Take 30 mg by mouth at bedtime as needed for sleep.   Marland Kitchen  traZODone (DESYREL) 150 MG tablet Take by mouth at bedtime.  Marland Kitchen Ubrogepant (UBRELVY) 50 MG TABS 2 tablets as needed.   . vortioxetine HBr (TRINTELLIX) 20 MG TABS tablet Take 1 tablet by mouth daily.  Marland Kitchen zonisamide (ZONEGRAN) 100 MG capsule Take 200 mg by mouth daily.     PAST MEDICAL HISTORY: Past Medical History:  Diagnosis Date  . Depression   . Hypertension   . Migraines   . Mitral regurgitation     PAST SURGICAL  HISTORY: Past Surgical History:  Procedure Laterality Date  . FOOT SURGERY Left   . KNEE ARTHROCENTESIS Left     FAMILY HISTORY: The patient family history includes Heart disease in her mother; Stroke in her father.  SOCIAL HISTORY:  The patient  reports that she has never smoked. She has never used smokeless tobacco. She reports that she does not drink alcohol and does not use drugs.  REVIEW OF SYSTEMS: Review of Systems  Constitutional: Negative for chills and fever.  HENT: Negative for hoarse voice and nosebleeds.   Eyes: Negative for discharge, double vision and pain.  Cardiovascular: Positive for chest pain. Negative for claudication, dyspnea on exertion, leg swelling, orthopnea, palpitations, paroxysmal nocturnal dyspnea and syncope.  Respiratory: Negative for hemoptysis and shortness of breath.   Musculoskeletal: Negative for muscle cramps and myalgias.  Gastrointestinal: Negative for abdominal pain, constipation, diarrhea, hematemesis, hematochezia, melena, nausea and vomiting.  Neurological: Positive for dizziness. Negative for light-headedness.    PHYSICAL EXAM: Vitals with BMI 02/02/2020 02/02/2020 01/09/2020  Height - 5' 6"  -  Weight - 150 lbs -  BMI - 35.59 -  Systolic [phone]  Diastolic 80 92 94  Pulse 90 94 88   CONSTITUTIONAL: Well-developed and well-nourished. No acute distress.  SKIN: Skin is warm and dry. No rash noted. No cyanosis. No pallor. No jaundice HEAD: Normocephalic and atraumatic.  EYES: No scleral icterus MOUTH/THROAT: Moist oral membranes.  NECK: No JVD present. No thyromegaly noted. No carotid bruits  LYMPHATIC: No visible cervical adenopathy.  CHEST Normal respiratory effort. No intercostal retractions  LUNGS: Clear to auscultation bilaterally.  No stridor. No wheezes. No rales.  CARDIOVASCULAR: Regular rate and rhythm, positive M4-W8, soft holosystolic murmur heard at the apex, no gallops or rubs ABDOMINAL: No apparent ascites.   EXTREMITIES: No peripheral edema, warm to touch bilaterally.  2+ dorsalis pedis and posterior tibial pulses. HEMATOLOGIC: No significant bruising NEUROLOGIC: Oriented to person, place, and time. Nonfocal. Normal muscle tone.  PSYCHIATRIC: Normal mood and affect. Normal behavior. Cooperative  CARDIAC DATABASE: EKG: 01/09/2020: Normal sinus rhythm, 88 bpm, left axis deviation, poor R wave progression, LVH per voltage criteria, nonspecific T wave abnormality, without underlying injury pattern.  Echocardiogram: 01/15/2020: Normal LV systolic function with visual EF 60-65%. Left ventricle cavity is normal in size. Normal global wall motion. Indeterminate diastolic filling pattern, elevated LAP. No obvious regional wall motion abnormalities. Mild (Grade I) mitral regurgitation. Mild tricuspid regurgitation. No evidence of pulmonary hypertension. RVSP measures 33 mmHg. Mild pulmonic regurgitation. IVC is dilated with a respiratory response of <50%. Compared to prior study dated 12/19/2019: MR and TR have improved since prior study (moderate to now mild) no other significant change.    Stress Testing: Exercise Sestamibi stress test 01/14/2020: Exercise nuclear stress test was performed using Bruce protocol. Patient reached 10.1 METS, and 93% of age predicted maximum heart rate. Exercise capacity was normal. Chest pain not reported. Heart rate and hemodynamic response were normal. Stress EKG revealed no ischemic changes.  SPECT images show apical thinning, likely physiological. Stress LVEF 69%. Low risk study.   Heart Catheterization: None  LABORATORY DATA: External Labs: Collected: 12/05/2019 Creatinine 0.9 mg/dL. eGFR: 66 mL/min per 1.73 m Lipid profile: Total cholesterol 166, triglycerides 98, HDL 61, LDL 85, non HDL 105 Hemoglobin A1c: 4.9 TSH: 1.49   IMPRESSION:    ICD-10-CM   1. Nonrheumatic mitral valve regurgitation  I34.0   2. Precordial chest pain  R07.2   3. Benign  hypertension  I10   4. Encounter to discuss test results  Z71.2      RECOMMENDATIONS: ZIZA HASTINGS is a 54 y.o. female whose past medical history and cardiac risk factors include: hypertension, depression, nonrheumatic mitral regurgitation.   Nonrheumatic mitral valve regurgitation: Improving/stable  Patient has no prior history of rheumatic fever.  Repeat echocardiogram notes mild mitral regurgitation.   She no longer has shortness of breath which she had during initial consultation.  We will continue working on improving her blood pressure management with a goal systolic blood pressures between 120-130 mmHg.  No additional work-up needed at this time.  She will need follow-up echocardiogram to evaluate the progression of MR.  Precordial chest pain: Improving  Patient symptoms of precordial chest pain have improved since last office visit.  The symptoms are predominantly atypical in nature.  Patient is ischemic evaluation including an echocardiogram and stress test are essentially unremarkable.  Would recommend following up with PCP for noncardiac causes of her chest discomfort.  If symptoms continue or become progressive may consider coronary CTA at a later date.  Patient is agreeable with the plan of care.  I will see her back in close follow-up in 2 months.  Patient is agreeable with the plan of care and is thankful.  Benign essential hypertension: Improving  Medications reconciled.  Currently managed by primary care provider.    Since last office visit have restarted her chlorthalidone every other day and her systolic blood pressures have been ranging around 130-140 mmHg.    FINAL MEDICATION LIST END OF ENCOUNTER: No orders of the defined types were placed in this encounter.     Current Outpatient Medications:  .  brexpiprazole (REXULTI) 1 MG TABS tablet, Take 1 tablet by mouth daily., Disp: , Rfl:  .  chlorthalidone (HYGROTON) 25 MG tablet, Take 25 mg by mouth every  other day., Disp: , Rfl:  .  diltiazem (TIAZAC) 240 MG 24 hr capsule, Take 240 mg by mouth daily. , Disp: , Rfl:  .  doxazosin (CARDURA) 2 MG tablet, Take 1 tablet by mouth daily., Disp: , Rfl:  .  irbesartan (AVAPRO) 300 MG tablet, Take 1 tablet by mouth daily., Disp: , Rfl:  .  L-Methylfolate (DEPLIN) 7.5 MG TABS, Take 15 mg by mouth daily. , Disp: , Rfl:  .  lamoTRIgine (LAMICTAL) 150 MG tablet, Take 150 mg by mouth 2 (two) times daily. , Disp: , Rfl:  .  linaclotide (LINZESS) 290 MCG CAPS capsule, 1 capsule as needed., Disp: , Rfl:  .  temazepam (RESTORIL) 30 MG capsule, Take 30 mg by mouth at bedtime as needed for sleep. , Disp: , Rfl:  .  traZODone (DESYREL) 150 MG tablet, Take by mouth at bedtime., Disp: , Rfl:  .  Ubrogepant (UBRELVY) 50 MG TABS, 2 tablets as needed. , Disp: , Rfl:  .  vortioxetine HBr (TRINTELLIX) 20 MG TABS tablet, Take 1 tablet by mouth daily., Disp: , Rfl:  .  zonisamide (ZONEGRAN) 100 MG capsule, Take  200 mg by mouth daily., Disp: , Rfl:   No orders of the defined types were placed in this encounter.  There are no Patient Instructions on file for this visit.  --Continue cardiac medications as reconciled in final medication list. --Return in about 2 months (around 04/03/2020) for Reevaluation of chest pain. Or sooner if needed. --Continue follow-up with your primary care physician regarding the management of your other chronic comorbid conditions.  Patient's questions and concerns were addressed to her satisfaction. She voices understanding of the instructions provided during this encounter.   This note was created using a voice recognition software as a result there may be grammatical errors inadvertently enclosed that do not reflect the nature of this encounter. Every attempt is made to correct such errors.  Rex Kras, [place], [facility]  Pager: [phone] Office: [phone]

## 2020-02-05 ENCOUNTER — Other Ambulatory Visit: Payer: Self-pay

## 2020-02-05 ENCOUNTER — Ambulatory Visit
Admission: RE | Admit: 2020-02-05 | Discharge: 2020-02-05 | Disposition: A | Discharge status: Home / Self Care | Payer: BC Managed Care – PPO | Source: Ambulatory Visit | Attending: Internal Medicine | Admitting: Internal Medicine

## 2020-02-05 DIAGNOSIS — Z1231 Encounter for screening mammogram for malignant neoplasm of breast: Secondary | ICD-10-CM

## 2020-02-09 ENCOUNTER — Ambulatory Visit: Payer: BC Managed Care – PPO | Admitting: Cardiology

## 2020-03-01 ENCOUNTER — Other Ambulatory Visit: Payer: Self-pay

## 2020-04-05 ENCOUNTER — Ambulatory Visit: Payer: BC Managed Care – PPO | Admitting: Cardiology

## 2020-04-05 ENCOUNTER — Encounter: Payer: Self-pay | Admitting: Cardiology

## 2020-04-05 ENCOUNTER — Other Ambulatory Visit: Payer: Self-pay

## 2020-04-05 VITALS — BP 138/78 | HR 84 | Ht 66.0 in | Wt 153.0 lb

## 2020-04-05 DIAGNOSIS — I340 Nonrheumatic mitral (valve) insufficiency: Secondary | ICD-10-CM

## 2020-04-05 DIAGNOSIS — R072 Precordial pain: Secondary | ICD-10-CM

## 2020-04-05 NOTE — Progress Notes (Signed)
Date:  04/05/2020   ID:  Cynthia Roberts, DOB [birth year], MRN [id number]  PCP:  Crist Infante, MD  Cardiologist:  Rex Kras, DO, [facility] (established care 01/09/2020)  Date: 04/05/20 Last Office Visit: 02/02/2020  Chief Complaint  Patient presents with  . Chest Pain  . Follow-up    HPI  Cynthia Roberts is a 55 y.o. female who presents to the office with a chief complaint of " reevaluation of chest pain." Patient's past medical history and cardiovascular risk factors include: hypertension, depression, nonrheumatic mitral regurgitation.   She is referred to the office at the request of Crist Infante, MD for evaluation of mitral regurgitation and chest pain.  During her yearly physical she was noted to have her cardiac murmur and was referred to cardiology for further evaluation and management.  Echocardiogram noted moderate mitral regurgitation which is improved after better blood pressure management.  Most recent echocardiogram noted mild mitral regurgitation and overall asymptomatic.  During prior office visit she is noted to have atypical chest pain and has undergone a stress test which is overall low risk study.  She is here for 57-monthreevaluation of chest pain.  Since last visit patient states that her chest pressure symptoms have occurred approximately 3 times over the last 2 months.  Symptoms are intermittent, each episode lasting several hours, and self-limited.  The discomfort is not brought on by effort related activities nor does resolve with rest.  Patient's blood pressure has also improved.  Home systolic blood pressures range between 1287-681mmHg and diastolic blood pressures range between 65-70 mmHg.  Patient is no longer having symptoms of lightheaded or dizziness.  No family history of premature coronary disease or sudden cardiac death.  ALLERGIES: No Known Allergies  MEDICATION LIST PRIOR TO VISIT: Current Meds  Medication Sig  . brexpiprazole (REXULTI) 1 MG TABS  tablet Take 1 tablet by mouth daily.  . chlorthalidone (HYGROTON) 25 MG tablet Take 25 mg by mouth every other day.  . diltiazem (TIAZAC) 240 MG 24 hr capsule Take 240 mg by mouth daily.   .Marland Kitchendoxazosin (CARDURA) 2 MG tablet Take 1 tablet by mouth daily.  . irbesartan (AVAPRO) 300 MG tablet Take 1 tablet by mouth daily.  .Marland KitchenL-Methylfolate (DEPLIN) 7.5 MG TABS Take 15 mg by mouth daily.   .Marland KitchenlamoTRIgine (LAMICTAL) 150 MG tablet Take 150 mg by mouth 2 (two) times daily.   .Marland Kitchenlinaclotide (LINZESS) 290 MCG CAPS capsule 1 capsule as needed.  . temazepam (RESTORIL) 30 MG capsule Take 30 mg by mouth at bedtime as needed for sleep.   . traZODone (DESYREL) 150 MG tablet Take by mouth at bedtime.  .Marland KitchenUbrogepant (UBRELVY) 50 MG TABS 2 tablets as needed.   . vortioxetine HBr (TRINTELLIX) 20 MG TABS tablet Take 1 tablet by mouth daily.  .Marland Kitchenzonisamide (ZONEGRAN) 100 MG capsule Take 200 mg by mouth daily.     PAST MEDICAL HISTORY: Past Medical History:  Diagnosis Date  . Depression   . Hypertension   . Migraines   . Mitral regurgitation     PAST SURGICAL HISTORY: Past Surgical History:  Procedure Laterality Date  . FOOT SURGERY Left   . KNEE ARTHROCENTESIS Left     FAMILY HISTORY: The patient family history includes Heart disease in her mother; Stroke in her father.  SOCIAL HISTORY:  The patient  reports that she has never smoked. She has never used smokeless tobacco. She reports that she does not drink alcohol and does  not use drugs.  REVIEW OF SYSTEMS: Review of Systems  Constitutional: Negative for chills and fever.  HENT: Negative for hoarse voice and nosebleeds.   Eyes: Negative for discharge, double vision and pain.  Cardiovascular: Positive for chest pain (improving, atypical). Negative for claudication, dyspnea on exertion, leg swelling, orthopnea, palpitations, paroxysmal nocturnal dyspnea and syncope.  Respiratory: Negative for hemoptysis and shortness of breath.   Musculoskeletal:  Negative for muscle cramps and myalgias.  Gastrointestinal: Negative for abdominal pain, constipation, diarrhea, hematemesis, hematochezia, melena, nausea and vomiting.  Neurological: Negative for dizziness and light-headedness.    PHYSICAL EXAM: Vitals with BMI 04/05/2020 02/02/2020 02/02/2020  Height 5' 6"  - 5' 6"   Weight 153 lbs - 150 lbs  BMI 09.73 - 53.29  Systolic 924 268 341  Diastolic 78 80 92  Pulse 84 90 94   CONSTITUTIONAL: Well-developed and well-nourished. No acute distress.  SKIN: Skin is warm and dry. No rash noted. No cyanosis. No pallor. No jaundice HEAD: Normocephalic and atraumatic.  EYES: No scleral icterus MOUTH/THROAT: Moist oral membranes.  NECK: No JVD present. No thyromegaly noted. No carotid bruits  LYMPHATIC: No visible cervical adenopathy.  CHEST Normal respiratory effort. No intercostal retractions  LUNGS: Clear to auscultation bilaterally.  No stridor. No wheezes. No rales.  CARDIOVASCULAR: Regular rate and rhythm, positive D6-Q2, soft holosystolic murmur heard at the apex, no gallops or rubs ABDOMINAL: No apparent ascites.  EXTREMITIES: No peripheral edema, warm to touch bilaterally.  2+ dorsalis pedis and posterior tibial pulses. HEMATOLOGIC: No significant bruising NEUROLOGIC: Oriented to person, place, and time. Nonfocal. Normal muscle tone.  PSYCHIATRIC: Normal mood and affect. Normal behavior. Cooperative  CARDIAC DATABASE: EKG: 01/09/2020: Normal sinus rhythm, 88 bpm, left axis deviation, poor R wave progression, LVH per voltage criteria, nonspecific T wave abnormality, without underlying injury pattern.  Echocardiogram: 01/15/2020: Normal LV systolic function with visual EF 60-65%. Left ventricle cavity is normal in size. Normal global wall motion. Indeterminate diastolic filling pattern, elevated LAP. No obvious regional wall motion abnormalities. Mild (Grade I) mitral regurgitation. Mild tricuspid regurgitation. No evidence of pulmonary  hypertension. RVSP measures 33 mmHg. Mild pulmonic regurgitation. IVC is dilated with a respiratory response of <50%. Compared to prior study dated 12/19/2019: MR and TR have improved since prior study (moderate to now mild) no other significant change.    Stress Testing: Exercise Sestamibi stress test 01/14/2020: Exercise nuclear stress test was performed using Bruce protocol. Patient reached 10.1 METS, and 93% of age predicted maximum heart rate. Exercise capacity was normal. Chest pain not reported. Heart rate and hemodynamic response were normal. Stress EKG revealed no ischemic changes. SPECT images show apical thinning, likely physiological. Stress LVEF 69%. Low risk study.   Heart Catheterization: None  LABORATORY DATA: External Labs: Collected: 12/05/2019 Creatinine 0.9 mg/dL. eGFR: 66 mL/min per 1.73 m Lipid profile: Total cholesterol 166, triglycerides 98, HDL 61, LDL 85, non HDL 105 Hemoglobin A1c: 4.9 TSH: 1.49   External Labs: Collected: 03/08/2020 Creatinine 1 mg/dL. eGFR: 58 mL/min per 1.73 m Sodium 139, potassium 4.1, chloride 101, bicarb 30, AST 13, ALT 10, alkaline phosphatase 102.  IMPRESSION:    ICD-10-CM   1. Precordial chest pain  R07.2 EKG 12-Lead  2. Nonrheumatic mitral valve regurgitation  I34.0 EKG 12-Lead     RECOMMENDATIONS: KENIDEE CREGAN is a 55 y.o. female whose past medical history and cardiac risk factors include: hypertension, depression, nonrheumatic mitral regurgitation.   Nonrheumatic mitral valve regurgitation: Stable  Educated on the importance of blood  pressure management.  No additional work-up needed at this time.  Would recommend repeat echocardiogram in 3 to 5 years or sooner if change in clinical status.    Precordial chest pain: Improving  Her symptoms of precordial pain are atypical in nature.  Ischemic evaluation overall favorable.  Continue to monitor.  Would recommend following up with PCP for noncardiac causes  of her chest discomfort.  If symptoms continue or become progressive may consider coronary CTA at a later date.  Patient is asked to seek medical attention if she has symptoms of typical chest pain as discussed at today's visit or if her atypical symptoms increase in intensity, frequency, and/or duration.  Benign essential hypertension: Stable   Home blood pressures are well controlled.    Medications reconciled.  Currently managed by primary care provider.    I would like to see her back in 1 year or sooner if needed.  Plan of care discussed with both the patient and her mom at today's office visit.  FINAL MEDICATION LIST END OF ENCOUNTER: No orders of the defined types were placed in this encounter.     Current Outpatient Medications:  .  brexpiprazole (REXULTI) 1 MG TABS tablet, Take 1 tablet by mouth daily., Disp: , Rfl:  .  chlorthalidone (HYGROTON) 25 MG tablet, Take 25 mg by mouth every other day., Disp: , Rfl:  .  diltiazem (TIAZAC) 240 MG 24 hr capsule, Take 240 mg by mouth daily. , Disp: , Rfl:  .  doxazosin (CARDURA) 2 MG tablet, Take 1 tablet by mouth daily., Disp: , Rfl:  .  irbesartan (AVAPRO) 300 MG tablet, Take 1 tablet by mouth daily., Disp: , Rfl:  .  L-Methylfolate (DEPLIN) 7.5 MG TABS, Take 15 mg by mouth daily. , Disp: , Rfl:  .  lamoTRIgine (LAMICTAL) 150 MG tablet, Take 150 mg by mouth 2 (two) times daily. , Disp: , Rfl:  .  linaclotide (LINZESS) 290 MCG CAPS capsule, 1 capsule as needed., Disp: , Rfl:  .  temazepam (RESTORIL) 30 MG capsule, Take 30 mg by mouth at bedtime as needed for sleep. , Disp: , Rfl:  .  traZODone (DESYREL) 150 MG tablet, Take by mouth at bedtime., Disp: , Rfl:  .  Ubrogepant (UBRELVY) 50 MG TABS, 2 tablets as needed. , Disp: , Rfl:  .  vortioxetine HBr (TRINTELLIX) 20 MG TABS tablet, Take 1 tablet by mouth daily., Disp: , Rfl:  .  zonisamide (ZONEGRAN) 100 MG capsule, Take 200 mg by mouth daily., Disp: , Rfl:   Orders Placed This  Encounter  Procedures  . EKG 12-Lead   There are no Patient Instructions on file for this visit.  --Continue cardiac medications as reconciled in final medication list. --Return in about 1 year (around 04/05/2021) for Follow up chest pain. . Or sooner if needed. --Continue follow-up with your primary care physician regarding the management of your other chronic comorbid conditions.  Patient's questions and concerns were addressed to her satisfaction. She voices understanding of the instructions provided during this encounter.   This note was created using a voice recognition software as a result there may be grammatical errors inadvertently enclosed that do not reflect the nature of this encounter. Every attempt is made to correct such errors.  Rex Kras, [place], [facility]  Pager: [phone] Office: [phone]

## 2020-09-22 ENCOUNTER — Ambulatory Visit (INDEPENDENT_AMBULATORY_CARE_PROVIDER_SITE_OTHER): Payer: BC Managed Care – PPO | Admitting: Pulmonary Disease

## 2020-09-22 ENCOUNTER — Encounter: Payer: Self-pay | Admitting: Pulmonary Disease

## 2020-09-22 ENCOUNTER — Other Ambulatory Visit: Payer: Self-pay

## 2020-09-22 VITALS — BP 136/84 | HR 71 | Ht 66.0 in | Wt 154.0 lb

## 2020-09-22 DIAGNOSIS — R059 Cough, unspecified: Secondary | ICD-10-CM

## 2020-09-22 MED ORDER — FLUTICASONE FUROATE-VILANTEROL 100-25 MCG/INH IN AEPB: 1.0000 | INHALATION_SPRAY | Freq: Every day | RESPIRATORY_TRACT | 3 refills | Status: DC

## 2020-09-22 NOTE — Patient Instructions (Signed)
Start Breo Ellipta 1 puff daily - rinse mouth out after each use  We will check a CT Chest scan for further evaluation  Please call us in 1 month after using the inhaler and let us know how your cough is doing. If you continue to have cough and the CT chest is unrevealing we will do a trial of reflux treatment.

## 2020-09-22 NOTE — Progress Notes (Signed)
Synopsis: Referred in June 2022 for Cough  Subjective:   PATIENT ID: Cynthia Roberts GENDER: female DOB: 29-Oct-1965, MRN: [id number]   HPI  Chief Complaint  Patient presents with   Consult    Patient reports dry cough x 4 months on and off. No OTC meds to help.     Cynthia Roberts is a 55 year old woman, never smoker with with history of hypertension, depression and migraines who was referred to pulmonary clinic for chronic cough.  She reports developing a nonproductive cough in January or February of this year which she continues to experience intermittently.  She denies any sputum production and denies hemoptysis.  She was seen by her primary care physician last month who performed a chest radiograph which showed a left lower lobe infiltrate and was treated with a course of cefdinir with some improvement in her cough.  The cough occurs spontaneously, she denies any aggravating or alleviating factors.  The cough does not wake her from her sleep at night.  She denies any wheezing or shortness of breath with the cough.  She denies any limitations to her daily activities due to the cough.  She denies any trouble swallowing of solids or liquids.  She denies any changes in her voice.  She denies any sinus congestion or drainage.  She denies any heartburn or reflux symptoms.  She denies any skin rashes, diffuse joint aches, morning stiffness or muscle aches.  No family history of respiratory conditions and no family history of autoimmune conditions.  Past Medical History:  Diagnosis Date   Depression    Hypertension    Migraines    Mitral regurgitation      Family History  Problem Relation Age of Onset   Heart disease Mother    Stroke Father    Breast cancer Neg Hx      Social History   Socioeconomic History   Marital status: Single    Spouse name: Not on file   Number of children: 0   Years of education: Not on file   Highest education level: Not on file  Occupational  History   Not on file  Tobacco Use   Smoking status: Never   Smokeless tobacco: Never  Vaping Use   Vaping Use: Never used  Substance and Sexual Activity   Alcohol use: No   Drug use: No   Sexual activity: Not on file  Other Topics Concern   Not on file  Social History Narrative   Not on file   Social Determinants of Health   Financial Resource Strain: Not on file  Food Insecurity: Not on file  Transportation Needs: Not on file  Physical Activity: Not on file  Stress: Not on file  Social Connections: Not on file  Intimate Partner Violence: Not on file     No Known Allergies   Outpatient Medications Prior to Visit  Medication Sig Dispense Refill   brexpiprazole (REXULTI) 1 MG TABS tablet Take 1 tablet by mouth daily.     chlorthalidone (HYGROTON) 25 MG tablet Take 25 mg by mouth every other day.     diltiazem (TIAZAC) 240 MG 24 hr capsule Take 240 mg by mouth daily.      doxazosin (CARDURA) 2 MG tablet Take 1 tablet by mouth daily.     Galcanezumab-gnlm (EMGALITY) 120 MG/ML SOAJ Inject 120 mg into the skin. Inject into skin as directed on pen.     irbesartan (AVAPRO) 300 MG tablet Take 1 tablet by  mouth daily.     L-Methylfolate (DEPLIN) 7.5 MG TABS Take 15 mg by mouth daily.      lamoTRIgine (LAMICTAL) 150 MG tablet Take 150 mg by mouth 2 (two) times daily.      linaclotide (LINZESS) 290 MCG CAPS capsule 1 capsule as needed.     temazepam (RESTORIL) 30 MG capsule Take 30 mg by mouth at bedtime as needed for sleep.      traZODone (DESYREL) 150 MG tablet Take by mouth at bedtime.     vortioxetine HBr (TRINTELLIX) 20 MG TABS tablet Take 1 tablet by mouth daily.     zonisamide (ZONEGRAN) 100 MG capsule Take 200 mg by mouth daily.     Ubrogepant (UBRELVY) 50 MG TABS 2 tablets as needed.  (Patient not taking: Reported on 09/22/2020)     No facility-administered medications prior to visit.    Review of Systems  Constitutional:  Negative for chills, fever, malaise/fatigue and  weight loss.  HENT:  Negative for congestion, sinus pain and sore throat.   Eyes: Negative.   Respiratory:  Positive for cough. Negative for hemoptysis, sputum production, shortness of breath and wheezing.   Cardiovascular:  Negative for chest pain, palpitations, orthopnea, claudication and leg swelling.  Gastrointestinal:  Negative for abdominal pain, heartburn, nausea and vomiting.  Genitourinary: Negative.   Musculoskeletal:  Negative for joint pain and myalgias.  Skin:  Negative for rash.  Neurological:  Negative for weakness.  Endo/Heme/Allergies: Negative.   Psychiatric/Behavioral: Negative.       Objective:   Vitals:   09/22/20 1534  BP: 136/84  Pulse: 71  SpO2: 97%  Weight: 154 lb (69.9 kg)  Height: 5\' 6"  (1.676 m)     Physical Exam Constitutional:      General: She is not in acute distress.    Appearance: Normal appearance. She is not ill-appearing.  HENT:     Head: Normocephalic and atraumatic.     Nose: Nose normal.     Mouth/Throat:     Mouth: Mucous membranes are moist.     Pharynx: Oropharynx is clear.  Eyes:     General: No scleral icterus.    Conjunctiva/sclera: Conjunctivae normal.     Pupils: Pupils are equal, round, and reactive to light.  Cardiovascular:     Rate and Rhythm: Normal rate and regular rhythm.     Pulses: Normal pulses.     Heart sounds: Normal heart sounds. No murmur heard. Pulmonary:     Effort: Pulmonary effort is normal.     Breath sounds: Normal breath sounds. No wheezing, rhonchi or rales.  Abdominal:     General: Bowel sounds are normal.     Palpations: Abdomen is soft.  Musculoskeletal:     Right lower leg: No edema.     Left lower leg: No edema.  Lymphadenopathy:     Cervical: No cervical adenopathy.  Skin:    General: Skin is warm and dry.  Neurological:     General: No focal deficit present.     Mental Status: She is alert.  Psychiatric:        Mood and Affect: Mood normal.        Behavior: Behavior normal.         Thought Content: Thought content normal.        Judgment: Judgment normal.    CBC No results found for: WBC, RBC, HGB, HCT, PLT, MCV, MCH, MCHC, RDW, LYMPHSABS, MONOABS, EOSABS, BASOSABS   Chest imaging: CXR 08/10/20 - (Per  outside report, unable to see images at this time.) Left lower lobe infiltrate, right lung is clear  PFT: No flowsheet data found.  Echo 01/15/20: Normal LV systolic function with visual EF 60-65%. Left ventricle cavity  is normal in size. Normal global wall motion. Indeterminate diastolic  filling pattern, elevated LAP. No obvious regional wall motion  abnormalities.  Mild (Grade I) mitral regurgitation.  Mild tricuspid regurgitation. No evidence of pulmonary hypertension. RVSP  measures 33 mmHg.  Mild pulmonic regurgitation.  Heart Catheterization:  Assessment & Plan:   Cough - Plan: CT Chest Wo Contrast, fluticasone furoate-vilanterol (BREO ELLIPTA) 100-25 MCG/INH AEPB  Discussion: Cynthia Roberts is a 55 year old woman, never smoker with with history of hypertension, depression and migraines who was referred to pulmonary clinic for chronic cough.  The etiology of her cough at this time is unclear.  She does not appear to have overt sinus congestion with postnasal drainage or GERD symptoms.  Cough variant asthma is a possibility.  We will check a CT chest scan to further evaluate her abnormal chest radiographs from last month to rule out any interstitial process or organizing pneumonia.  We will trial her on LABA/ICS inhaler therapy with Breo Ellipta 1 puff daily.  She is to give [place] a call in 3 to 4 weeks and let us know if the inhaler has helped her symptoms, if so we will then send in a prescription.  If her CT scan is unrevealing and she has no response to inhaler therapy we will then trial PPI therapy for silent reflux disease.  Patient to follow-up in 2 months.  Freda Jackson, MD [facility] Office:  [phone]   Current Outpatient Medications:    brexpiprazole (REXULTI) 1 MG TABS tablet, Take 1 tablet by mouth daily., Disp: , Rfl:    chlorthalidone (HYGROTON) 25 MG tablet, Take 25 mg by mouth every other day., Disp: , Rfl:    diltiazem (TIAZAC) 240 MG 24 hr capsule, Take 240 mg by mouth daily. , Disp: , Rfl:    doxazosin (CARDURA) 2 MG tablet, Take 1 tablet by mouth daily., Disp: , Rfl:    fluticasone furoate-vilanterol (BREO ELLIPTA) 100-25 MCG/INH AEPB, Inhale 1 puff into the lungs daily., Disp: 28 each, Rfl: 3   Galcanezumab-gnlm (EMGALITY) 120 MG/ML SOAJ, Inject 120 mg into the skin. Inject into skin as directed on pen., Disp: , Rfl:    irbesartan (AVAPRO) 300 MG tablet, Take 1 tablet by mouth daily., Disp: , Rfl:    L-Methylfolate (DEPLIN) 7.5 MG TABS, Take 15 mg by mouth daily. , Disp: , Rfl:    lamoTRIgine (LAMICTAL) 150 MG tablet, Take 150 mg by mouth 2 (two) times daily. , Disp: , Rfl:    linaclotide (LINZESS) 290 MCG CAPS capsule, 1 capsule as needed., Disp: , Rfl:    temazepam (RESTORIL) 30 MG capsule, Take 30 mg by mouth at bedtime as needed for sleep. , Disp: , Rfl:    traZODone (DESYREL) 150 MG tablet, Take by mouth at bedtime., Disp: , Rfl:    vortioxetine HBr (TRINTELLIX) 20 MG TABS tablet, Take 1 tablet by mouth daily., Disp: , Rfl:    zonisamide (ZONEGRAN) 100 MG capsule, Take 200 mg by mouth daily., Disp: , Rfl:

## 2020-10-08 ENCOUNTER — Other Ambulatory Visit: Payer: Self-pay

## 2020-10-08 ENCOUNTER — Ambulatory Visit (INDEPENDENT_AMBULATORY_CARE_PROVIDER_SITE_OTHER)
Admission: RE | Admit: 2020-10-08 | Discharge: 2020-10-08 | Disposition: A | Discharge status: Home / Self Care | Payer: BC Managed Care – PPO | Source: Ambulatory Visit | Attending: Pulmonary Disease | Admitting: Pulmonary Disease

## 2020-10-08 DIAGNOSIS — R059 Cough, unspecified: Secondary | ICD-10-CM

## 2020-11-15 ENCOUNTER — Ambulatory Visit (INDEPENDENT_AMBULATORY_CARE_PROVIDER_SITE_OTHER): Payer: BC Managed Care – PPO | Admitting: Pulmonary Disease

## 2020-11-15 ENCOUNTER — Ambulatory Visit: Payer: BC Managed Care – PPO | Admitting: Pulmonary Disease

## 2020-11-15 ENCOUNTER — Encounter: Payer: Self-pay | Admitting: Pulmonary Disease

## 2020-11-15 ENCOUNTER — Other Ambulatory Visit: Payer: Self-pay

## 2020-11-15 VITALS — BP 128/84 | HR 68 | Temp 98.3°F | Ht 66.0 in | Wt 156.8 lb

## 2020-11-15 DIAGNOSIS — R059 Cough, unspecified: Secondary | ICD-10-CM

## 2020-11-15 NOTE — Patient Instructions (Signed)
Please give [place] a call if your cough returns and we can schedule you for a follow up visit.   Have a nice school year!

## 2020-11-15 NOTE — Progress Notes (Signed)
Synopsis: Referred in June 2022 for Cough  Subjective:   PATIENT ID: Cynthia Roberts GENDER: female DOB: 20-Nov-1965, MRN: [id number]   HPI  Chief Complaint  Patient presents with   Follow-up    Not coughing feel better since last visit.   Cynthia Roberts is a 55 year old woman, never smoker with with history of hypertension, depression and migraines who returns to pulmonary clinic for chronic cough.  She reports her cough resolved after taking a prednisone prescribed by her neurologist for a migraine. She took the prednisone for a couple of weeks which she finished recently. She reports the Breo ellipta inhaler did not help with her cough.   She again denies any sinus congestion or drianage, GERD symptoms or issues with allergies.   OV 09/22/20 She reports developing a nonproductive cough in January or February of this year which she continues to experience intermittently.  She denies any sputum production and denies hemoptysis.  She was seen by her primary care physician last month who performed a chest radiograph which showed a left lower lobe infiltrate and was treated with a course of cefdinir with some improvement in her cough.  The cough occurs spontaneously, she denies any aggravating or alleviating factors.  The cough does not wake her from her sleep at night.  She denies any wheezing or shortness of breath with the cough.  She denies any limitations to her daily activities due to the cough.  She denies any trouble swallowing of solids or liquids.  She denies any changes in her voice.  She denies any sinus congestion or drainage.  She denies any heartburn or reflux symptoms.  She denies any skin rashes, diffuse joint aches, morning stiffness or muscle aches.  No family history of respiratory conditions and no family history of autoimmune conditions.  Past Medical History:  Diagnosis Date   Depression    Hypertension    Migraines    Mitral regurgitation      Family History   Problem Relation Age of Onset   Heart disease Mother    Stroke Father    Breast cancer Neg Hx      Social History   Socioeconomic History   Marital status: Single    Spouse name: Not on file   Number of children: 0   Years of education: Not on file   Highest education level: Not on file  Occupational History   Not on file  Tobacco Use   Smoking status: Never   Smokeless tobacco: Never  Vaping Use   Vaping Use: Never used  Substance and Sexual Activity   Alcohol use: No   Drug use: No   Sexual activity: Not on file  Other Topics Concern   Not on file  Social History Narrative   Not on file   Social Determinants of Health   Financial Resource Strain: Not on file  Food Insecurity: Not on file  Transportation Needs: Not on file  Physical Activity: Not on file  Stress: Not on file  Social Connections: Not on file  Intimate Partner Violence: Not on file     No Known Allergies   Outpatient Medications Prior to Visit  Medication Sig Dispense Refill   brexpiprazole (REXULTI) 1 MG TABS tablet Take 1 tablet by mouth daily.     chlorthalidone (HYGROTON) 25 MG tablet Take 25 mg by mouth every other day.     diltiazem (TIAZAC) 240 MG 24 hr capsule Take 240 mg by mouth daily.  doxazosin (CARDURA) 2 MG tablet Take 1 tablet by mouth daily.     Galcanezumab-gnlm (EMGALITY) 120 MG/ML SOAJ Inject 120 mg into the skin. Inject into skin as directed on pen.     irbesartan (AVAPRO) 300 MG tablet Take 1 tablet by mouth daily.     L-Methylfolate (DEPLIN) 7.5 MG TABS Take 15 mg by mouth daily.      lamoTRIgine (LAMICTAL) 150 MG tablet Take 150 mg by mouth 2 (two) times daily.      linaclotide (LINZESS) 290 MCG CAPS capsule 1 capsule as needed.     temazepam (RESTORIL) 30 MG capsule Take 30 mg by mouth at bedtime as needed for sleep.      traZODone (DESYREL) 150 MG tablet Take by mouth at bedtime.     vortioxetine HBr (TRINTELLIX) 20 MG TABS tablet Take 1 tablet by mouth daily.      zonisamide (ZONEGRAN) 100 MG capsule Take 200 mg by mouth daily.     fluticasone furoate-vilanterol (BREO ELLIPTA) 100-25 MCG/INH AEPB Inhale 1 puff into the lungs daily. (Patient not taking: Reported on 11/15/2020) 28 each 3   No facility-administered medications prior to visit.    Review of Systems  Constitutional:  Negative for chills, fever, malaise/fatigue and weight loss.  HENT:  Negative for congestion, sinus pain and sore throat.   Eyes: Negative.   Respiratory:  Negative for cough, hemoptysis, sputum production, shortness of breath and wheezing.   Cardiovascular:  Negative for chest pain, palpitations, orthopnea, claudication and leg swelling.  Gastrointestinal:  Negative for abdominal pain, heartburn, nausea and vomiting.  Genitourinary: Negative.   Musculoskeletal:  Negative for joint pain and myalgias.  Skin:  Negative for rash.  Neurological:  Positive for headaches. Negative for weakness.  Endo/Heme/Allergies: Negative.   Psychiatric/Behavioral: Negative.       Objective:   Vitals:   11/15/20 1632  BP: 128/84  Pulse: 68  Temp: 98.3 F (36.8 C)  SpO2: 97%  Weight: 71.1 kg  Height: '5\' 6"'$  (1.676 m)     Physical Exam Constitutional:      General: She is not in acute distress.    Appearance: Normal appearance. She is not ill-appearing.  HENT:     Head: Normocephalic and atraumatic.  Eyes:     General: No scleral icterus.    Conjunctiva/sclera: Conjunctivae normal.     Pupils: Pupils are equal, round, and reactive to light.  Cardiovascular:     Rate and Rhythm: Normal rate and regular rhythm.     Pulses: Normal pulses.     Heart sounds: Normal heart sounds. No murmur heard. Pulmonary:     Effort: Pulmonary effort is normal.     Breath sounds: Normal breath sounds. No wheezing, rhonchi or rales.  Abdominal:     General: Bowel sounds are normal.     Palpations: Abdomen is soft.  Musculoskeletal:     Right lower leg: No edema.     Left lower leg: No  edema.  Skin:    General: Skin is warm and dry.  Neurological:     General: No focal deficit present.     Mental Status: She is alert.    CBC No results found for: WBC, RBC, HGB, HCT, PLT, MCV, MCH, MCHC, RDW, LYMPHSABS, MONOABS, EOSABS, BASOSABS   Chest imaging: CT Chest 10/08/2020 Mediastinum/Nodes: No hilar or mediastinal adenopathy. The esophagus and the thyroid gland are grossly unremarkable. No mediastinal fluid collection.   Lungs/Pleura: Minimal left lung base subpleural scarring. No focal  consolidation, pleural effusion, pneumothorax. The central airways are patent.  CXR 08/10/20 - (Per outside report, unable to see images at this time.) Left lower lobe infiltrate, right lung is clear  PFT: No flowsheet data found.  Echo 01/15/20: Normal LV systolic function with visual EF 60-65%. Left ventricle cavity  is normal in size. Normal global wall motion. Indeterminate diastolic  filling pattern, elevated LAP. No obvious regional wall motion  abnormalities.  Mild (Grade I) mitral regurgitation.  Mild tricuspid regurgitation. No evidence of pulmonary hypertension. RVSP  measures 33 mmHg.  Mild pulmonic regurgitation.  Heart Catheterization:  Assessment & Plan:   Cough  Discussion: Cynthia Roberts is a 55 year old woman, never smoker with with history of hypertension, depression and migraines who returns to pulmonary clinic for chronic cough.  Her cough has resolved at this time which appears related to her recent prednisone taper provided for her migraine headache.   I discussed with her that the etiology of her cough remains unclear at this time as she benefited from prednisone and not from ICS/LABA inhaler therapy. Her CT chest scan did not indicate any parenchymal issues or significant airway issues. The distal airways surrounding her heart have some bronchiectatic changes but do not appear significant enough to lead to her dry cough.    Differential includes GERD,  sinus disease, non-asthmatic eosinophilic bronchitis, or post-infectious cough.   No further treatment or workup warranted at this time since her cough is resolved.  Patient to follow-up as needed.  Freda Jackson, MD [facility] Office: [phone]   Current Outpatient Medications:    brexpiprazole (REXULTI) 1 MG TABS tablet, Take 1 tablet by mouth daily., Disp: , Rfl:    chlorthalidone (HYGROTON) 25 MG tablet, Take 25 mg by mouth every other day., Disp: , Rfl:    diltiazem (TIAZAC) 240 MG 24 hr capsule, Take 240 mg by mouth daily. , Disp: , Rfl:    doxazosin (CARDURA) 2 MG tablet, Take 1 tablet by mouth daily., Disp: , Rfl:    Galcanezumab-gnlm (EMGALITY) 120 MG/ML SOAJ, Inject 120 mg into the skin. Inject into skin as directed on pen., Disp: , Rfl:    irbesartan (AVAPRO) 300 MG tablet, Take 1 tablet by mouth daily., Disp: , Rfl:    L-Methylfolate (DEPLIN) 7.5 MG TABS, Take 15 mg by mouth daily. , Disp: , Rfl:    lamoTRIgine (LAMICTAL) 150 MG tablet, Take 150 mg by mouth 2 (two) times daily. , Disp: , Rfl:    linaclotide (LINZESS) 290 MCG CAPS capsule, 1 capsule as needed., Disp: , Rfl:    temazepam (RESTORIL) 30 MG capsule, Take 30 mg by mouth at bedtime as needed for sleep. , Disp: , Rfl:    traZODone (DESYREL) 150 MG tablet, Take by mouth at bedtime., Disp: , Rfl:    vortioxetine HBr (TRINTELLIX) 20 MG TABS tablet, Take 1 tablet by mouth daily., Disp: , Rfl:    zonisamide (ZONEGRAN) 100 MG capsule, Take 200 mg by mouth daily., Disp: , Rfl:    fluticasone furoate-vilanterol (BREO ELLIPTA) 100-25 MCG/INH AEPB, Inhale 1 puff into the lungs daily. (Patient not taking: Reported on 11/15/2020), Disp: 28 each, Rfl: 3

## 2021-01-14 ENCOUNTER — Emergency Department (HOSPITAL_COMMUNITY): Payer: BC Managed Care – PPO

## 2021-01-14 ENCOUNTER — Emergency Department (HOSPITAL_COMMUNITY)
Admission: EM | Admit: 2021-01-14 | Discharge: 2021-01-15 | Disposition: A | Discharge status: Home / Self Care | Payer: BC Managed Care – PPO | Attending: Emergency Medicine | Admitting: Emergency Medicine

## 2021-01-14 ENCOUNTER — Other Ambulatory Visit: Payer: Self-pay

## 2021-01-14 DIAGNOSIS — R42 Dizziness and giddiness: Secondary | ICD-10-CM

## 2021-01-14 DIAGNOSIS — I10 Essential (primary) hypertension: Secondary | ICD-10-CM | POA: Insufficient documentation

## 2021-01-14 DIAGNOSIS — Z79899 Other long term (current) drug therapy: Secondary | ICD-10-CM | POA: Diagnosis not present

## 2021-01-14 DIAGNOSIS — E876 Hypokalemia: Secondary | ICD-10-CM

## 2021-01-14 LAB — TROPONIN I (HIGH SENSITIVITY)
Troponin I (High Sensitivity): 6 ng/L (ref <18)
Troponin I (High Sensitivity): 9 ng/L (ref <18)

## 2021-01-14 LAB — CBC
HCT: 37.7 % (ref 36.0–46.0)
Hemoglobin: 13.1 g/dL (ref 12.0–15.0)
MCH: 30.1 pg (ref 26.0–34.0)
MCHC: 34.7 g/dL (ref 30.0–36.0)
MCV: 86.7 fL (ref 80.0–100.0)
Platelets: 258 10*3/uL (ref 150–400)
RBC: 4.35 MIL/uL (ref 3.87–5.11)
RDW: 11.9 % (ref 11.5–15.5)
WBC: 9 10*3/uL (ref 4.0–10.5)
nRBC: 0 % (ref 0.0–0.2)

## 2021-01-14 LAB — BASIC METABOLIC PANEL
Anion gap: 12 (ref 5–15)
BUN: 18 mg/dL (ref 6–20)
CO2: 26 mmol/L (ref 22–32)
Calcium: 9.2 mg/dL (ref 8.9–10.3)
Chloride: 98 mmol/L (ref 98–111)
Creatinine, Ser: 0.82 mg/dL (ref 0.44–1.00)
GFR, Estimated: >60 mL/min (ref >60)
Glucose, Bld: 148 mg/dL — ABNORMAL HIGH (ref 70–99)
Potassium: 2.7 mmol/L — CL (ref 3.5–5.1)
Sodium: 136 mmol/L (ref 135–145)

## 2021-01-14 NOTE — ED Triage Notes (Signed)
Pt reports hour long dizzy spells x 2 weeks. Reports chest tightness today during a dizzy spell. Concerned about her blood pressure-ever since starting a new depression medication she has been intermittently hypertensive despite taking her meds for bp as prescribed.

## 2021-01-15 MED ORDER — POTASSIUM CHLORIDE 10 MEQ/100ML IV SOLN
10.0000 meq | Freq: Once | INTRAVENOUS | Status: AC
  Administered 2021-01-15: 10 meq via INTRAVENOUS
  Filled 2021-01-15: qty 100

## 2021-01-15 MED ORDER — SODIUM CHLORIDE 0.9 % IV BOLUS
1000.0000 mL | Freq: Once | INTRAVENOUS | Status: AC
  Administered 2021-01-15: 1000 mL via INTRAVENOUS

## 2021-01-15 MED ORDER — MECLIZINE HCL 25 MG PO TABS: 25.0000 mg | ORAL_TABLET | Freq: Three times a day (TID) | ORAL | 0 refills | Status: DC | PRN

## 2021-01-15 MED ORDER — POTASSIUM CHLORIDE CRYS ER 20 MEQ PO TBCR
40.0000 meq | EXTENDED_RELEASE_TABLET | Freq: Once | ORAL | Status: AC
  Administered 2021-01-15: 40 meq via ORAL
  Filled 2021-01-15: qty 2

## 2021-01-15 MED ORDER — POTASSIUM CHLORIDE CRYS ER 20 MEQ PO TBCR: 20.0000 meq | EXTENDED_RELEASE_TABLET | Freq: Two times a day (BID) | ORAL | 0 refills | Status: DC

## 2021-01-15 NOTE — Discharge Instructions (Signed)
Begin taking potassium as prescribed.  Begin taking meclizine as prescribed as needed for dizziness.  Keep a record of blood pressures at home and take this with you to your next doctor's appointment to discuss the results.  Return to the emergency department in the meantime if you develop worsening dizziness, severe headaches, chest pain, or other new and concerning symptoms.

## 2021-01-15 NOTE — ED Provider Notes (Signed)
[facility] DEPART[facility] Provider Note   CSN: [id number] Arrival date & time: 01/14/21  1656     History Chief Complaint  Patient presents with   Dizziness   Hypertension   Chest Pain    Cynthia Roberts is a 55 y.o. female.  Patient is a 55 year old female with past medical history of hypertension, migraines, mitral regurgitation.  Patient presenting today for evaluation of dizziness and chest tightness.  She reports a 2-week history of feeling dizzy.  She describes this as a spinning sensation and also a lightheadedness that seems to be worse when she stands.  Today she started having tightness in her chest along with the dizzy episode.  She denies shortness of breath, nausea, or diaphoresis.  She denies any weakness, numbness, or leg swelling.  She checked her blood pressure this afternoon and it was elevated.  The history is provided by the patient.  Dizziness Quality:  Lightheadedness and room spinning Severity:  Moderate Onset quality:  Gradual Duration:  2 weeks Timing:  Intermittent Progression:  Worsening Chronicity:  New Context: standing up   Associated symptoms: chest pain   Hypertension Associated symptoms include chest pain.  Chest Pain Associated symptoms: dizziness       Past Medical History:  Diagnosis Date   Depression    Hypertension    Migraines    Mitral regurgitation     There are no problems to display for this patient.   Past Surgical History:  Procedure Laterality Date   FOOT SURGERY Left    KNEE ARTHROCENTESIS Left      OB History   No obstetric history on file.     Family History  Problem Relation Age of Onset   Heart disease Mother    Stroke Father    Breast cancer Neg Hx     Social History   Tobacco Use   Smoking status: Never   Smokeless tobacco: Never  Vaping Use   Vaping Use: Never used  Substance Use Topics   Alcohol use: No   Drug use: No    Home Medications Prior to Admission  medications   Medication Sig Start Date End Date Taking? Authorizing Provider  brexpiprazole (REXULTI) 1 MG TABS tablet Take 1 tablet by mouth daily.    [provider]  chlorthalidone (HYGROTON) 25 MG tablet Take 25 mg by mouth every other day. 01/20/20   [provider]  diltiazem (TIAZAC) 240 MG 24 hr capsule Take 240 mg by mouth daily.     [provider]  doxazosin (CARDURA) 2 MG tablet Take 1 tablet by mouth daily. 10/19/19   [provider]  fluticasone furoate-vilanterol (BREO ELLIPTA) 100-25 MCG/INH AEPB Inhale 1 puff into the lungs daily. Patient not taking: Reported on 11/15/2020 09/22/20   Freddi Starr, MD  Galcanezumab-gnlm [facility]) 120 MG/ML SOAJ Inject 120 mg into the skin. Inject into skin as directed on pen.    [provider]  irbesartan (AVAPRO) 300 MG tablet Take 1 tablet by mouth daily.    [provider]  L-Methylfolate (DEPLIN) 7.5 MG TABS Take 15 mg by mouth daily.     [provider]  lamoTRIgine (LAMICTAL) 150 MG tablet Take 150 mg by mouth 2 (two) times daily.     [provider]  linaclotide (LINZESS) 290 MCG CAPS capsule 1 capsule as needed.    [provider]  temazepam (RESTORIL) 30 MG capsule Take 30 mg by mouth at bedtime as needed for  sleep.     [provider]  traZODone (DESYREL) 150 MG tablet Take by mouth at bedtime.    [provider]  vortioxetine HBr (TRINTELLIX) 20 MG TABS tablet Take 1 tablet by mouth daily.    [provider]  zonisamide (ZONEGRAN) 100 MG capsule Take 200 mg by mouth daily. 12/27/19   [provider]    Allergies    Amlodipine and Cefdinir  Review of Systems   Review of Systems  Cardiovascular:  Positive for chest pain.  Neurological:  Positive for dizziness.  All other systems reviewed and are negative.  Physical Exam Updated Vital Signs BP (!) 152/76 (BP Location: Left Arm)   Pulse 78   Temp 99 F (37.2  C) (Oral)   Resp 16   LMP 10/04/2014   SpO2 97%   Physical Exam Vitals and nursing note reviewed.  Constitutional:      General: She is not in acute distress.    Appearance: She is well-developed. She is not diaphoretic.  HENT:     Head: Normocephalic and atraumatic.  Cardiovascular:     Rate and Rhythm: Normal rate and regular rhythm.     Heart sounds: No murmur heard.   No friction rub. No gallop.  Pulmonary:     Effort: Pulmonary effort is normal. No respiratory distress.     Breath sounds: Normal breath sounds. No wheezing.  Abdominal:     General: Bowel sounds are normal. There is no distension.     Palpations: Abdomen is soft.     Tenderness: There is no abdominal tenderness.  Musculoskeletal:        General: Normal range of motion.     Cervical back: Normal range of motion and neck supple.     Right lower leg: No tenderness. No edema.     Left lower leg: No tenderness. No edema.  Skin:    General: Skin is warm and dry.  Neurological:     General: No focal deficit present.     Mental Status: She is alert and oriented to person, place, and time.     Cranial Nerves: No cranial nerve deficit.     Motor: No weakness.    ED Results / Procedures / Treatments   Labs (all labs ordered are listed, but only abnormal results are displayed) Labs Reviewed  BASIC METABOLIC PANEL - Abnormal; Notable for the following components:      Result Value   Potassium 2.7 (*)    Glucose, Bld 148 (*)    All other components within normal limits  CBC  TROPONIN I (HIGH SENSITIVITY)  TROPONIN I (HIGH SENSITIVITY)    EKG EKG Interpretation  Date/Time:  Friday January 14 2021 17:08:44 EDT Ventricular Rate:  77 PR Interval:  148 QRS Duration: 100 QT Interval:  412 QTC Calculation: 466 R Axis:   -36 Text Interpretation: Normal sinus rhythm Left axis deviation Abnormal ECG Confirmed by Veryl Speak [phone]) on 01/15/2021 12:20:07 AM  Radiology DG Chest 2 View  Result Date:  01/14/2021 CLINICAL DATA:  Chest pain.  Dizziness. EXAM: CHEST - 2 VIEW COMPARISON:  Chest CT 10/08/2020 FINDINGS: The cardiomediastinal contours are normal. Slightly rounded density adjacent to the right heart border corresponds to mediastinal fat on prior CT. The lungs are clear. Pulmonary vasculature is normal. No consolidation, pleural effusion, or pneumothorax. No acute osseous abnormalities are seen. IMPRESSION: No acute chest findings. Electronically Signed   By: Keith Rake M.D.   On: 01/14/2021 18:51  Procedures Procedures   Medications Ordered in ED Medications  sodium chloride 0.9 % bolus 1,000 mL (has no administration in time range)  potassium chloride 10 mEq in 100 mL IVPB (has no administration in time range)  potassium chloride SA (KLOR-CON) CR tablet 40 mEq (has no administration in time range)    ED Course  I have reviewed the triage vital signs and the nursing notes.  Pertinent labs & imaging results that were available during my care of the patient were reviewed by me and considered in my medical decision making (see chart for details).    MDM Rules/Calculators/A&P  Presenting here with complaints of dizziness as described in the HPI.  It seems to be worse when she stands.  She feels both like the room is spinning and is if she is going to pass out.  Patient's work-up essentially unremarkable including laboratory studies, EKG, and orthostatic vital signs.  She does have a potassium of 2.7 which was replaced with both IV and oral potassium.  At this point, I see no indication for admission.  I do feel as though discharge is appropriate.  Patient will be given a trial of meclizine and advised to keep a record of her blood pressures at home.  She was initially hypertensive upon presentation, but has improved without intervention.  Final Clinical Impression(s) / ED Diagnoses Final diagnoses:  None    Rx / DC Orders ED Discharge Orders     None        Veryl Speak, MD 01/15/21 [phone]

## 2021-01-15 NOTE — ED Notes (Signed)
T discharged and ambulated out of the ED without difficulty.

## 2021-01-20 ENCOUNTER — Other Ambulatory Visit: Payer: Self-pay

## 2021-01-20 ENCOUNTER — Encounter: Payer: Self-pay | Admitting: Cardiology

## 2021-01-20 ENCOUNTER — Ambulatory Visit: Payer: BC Managed Care – PPO | Admitting: Cardiology

## 2021-01-20 VITALS — BP 149/80 | HR 74 | Resp 16 | Ht 66.0 in | Wt 150.0 lb

## 2021-01-20 DIAGNOSIS — I10 Essential (primary) hypertension: Secondary | ICD-10-CM

## 2021-01-20 DIAGNOSIS — I340 Nonrheumatic mitral (valve) insufficiency: Secondary | ICD-10-CM

## 2021-01-20 DIAGNOSIS — R072 Precordial pain: Secondary | ICD-10-CM

## 2021-01-20 MED ORDER — METOPROLOL TARTRATE 25 MG PO TABS: 25.0000 mg | ORAL_TABLET | Freq: Two times a day (BID) | ORAL | 0 refills | Status: DC

## 2021-01-20 NOTE — Progress Notes (Signed)
Date:  01/20/2021   ID:  Cynthia Roberts, DOB 04-06-1965, MRN [id number]  PCP:  Crist Infante, MD  Cardiologist:  Rex Kras, DO, [facility] (established care 01/09/2020)  Date: 01/20/21 Last Office Visit: 04/05/2020   Chief Complaint  Patient presents with   Chest Pain   Dizziness   Hospitalization Follow-up    HPI  Cynthia Roberts is a 55 y.o. female who presents to the office with a chief complaint of " reevaluation of chest pain and recent ER visit." Patient's past medical history and cardiovascular risk factors include: hypertension, depression, nonrheumatic mitral regurgitation.   She is referred to the office at the request of Crist Infante, MD for evaluation of mitral regurgitation and chest pain.  During her yearly physical she was noted to have her cardiac murmur and was referred to cardiology for further evaluation and management.  Echocardiogram noted moderate mitral regurgitation which is improved after better blood pressure management.  Most recent echocardiogram noted mild mitral regurgitation and overall asymptomatic.  During prior office visit she is noted to have atypical chest pain and has undergone a stress test which is overall low risk study.  She is here for 91-monthreevaluation of chest pain.  Since last visit patient states that her chest pressure symptoms have occurred approximately 3 times over the last 2 months.  Symptoms are intermittent, each episode lasting several hours, and self-limited.  The discomfort is not brought on by effort related activities nor does resolve with rest.  Patient's blood pressure has also improved.  Home systolic blood pressures range between 1977-414mmHg and diastolic blood pressures range between 65-70 mmHg.  Patient is no longer having symptoms of lightheaded or dizziness.  No family history of premature coronary disease or sudden cardiac death.  Referred to the office after noticing a cardiac murmur on an annual physical  examination by PCP.  Patient underwent an echocardiogram which noted mild mitral regurgitation.  In the past she also had symptoms of atypical chest pain underwent stress test which was reported to be overall low risk study.  She now presents for follow-up after recent ER visit for chest pain and dizziness.  Precordial pain: Patient has been experiencing chest pressure located substernally, intensity 3 out of 10, pressure-like sensation, lasting for few hours, self-limited, radiates to the left shoulder, no improving or worsening factors, nonexertional, and does not resolve with rest.  For similar discomfort patient had gone to the ED on 01/14/2021.  ECG noted no underlying injury pattern, high sensitive troponins negative x2, and blood work noted hypokalemia and currently on potassium replacement therapy.  ALLERGIES: Allergies  Allergen Reactions   Amlodipine Other (See Comments)    Other reaction(s): headache headcaches    Cefdinir Itching    Other reaction(s): itching    MEDICATION LIST PRIOR TO VISIT: Current Meds  Medication Sig   acetaminophen (TYLENOL) 500 MG tablet Take 1,000 mg by mouth every 6 (six) hours as needed for moderate pain or headache.   brexpiprazole (REXULTI) 1 MG TABS tablet Take 1 tablet by mouth daily.   chlorthalidone (HYGROTON) 25 MG tablet Take 25 mg by mouth every other day.   diltiazem (TIAZAC) 240 MG 24 hr capsule Take 240 mg by mouth at bedtime.   doxazosin (CARDURA) 2 MG tablet Take 1 tablet by mouth at bedtime.   fluticasone furoate-vilanterol (BREO ELLIPTA) 100-25 MCG/INH AEPB Inhale 1 puff into the lungs daily.   Galcanezumab-gnlm (EMGALITY) 120 MG/ML SOAJ Inject 120 mg into the skin.  Inject into skin as directed on pen.   irbesartan (AVAPRO) 300 MG tablet Take 1 tablet by mouth at bedtime.   L-Methylfolate (DEPLIN) 7.5 MG TABS Take 15 mg by mouth daily.    lamoTRIgine (LAMICTAL) 150 MG tablet Take 300 mg by mouth at bedtime.   linaclotide (LINZESS)  290 MCG CAPS capsule 1 capsule as needed.   meclizine (ANTIVERT) 25 MG tablet Take 1 tablet (25 mg total) by mouth every 8 (eight) hours as needed for dizziness.   metoprolol tartrate (LOPRESSOR) 25 MG tablet Take 1 tablet (25 mg total) by mouth 2 (two) times daily for 15 days.   Multiple Vitamins-Minerals (CENTRUM VITAMINTS PO) Take 1 tablet by mouth daily.   potassium chloride SA (KLOR-CON) 20 MEQ tablet Take 1 tablet (20 mEq total) by mouth 2 (two) times daily.   prochlorperazine (COMPAZINE) 5 MG tablet Take 5 mg by mouth every 6 (six) hours as needed for nausea or vomiting.   temazepam (RESTORIL) 30 MG capsule Take 30 mg by mouth at bedtime as needed for sleep.    traZODone (DESYREL) 150 MG tablet Take by mouth at bedtime.   vortioxetine HBr (TRINTELLIX) 20 MG TABS tablet Take 1 tablet by mouth daily.     PAST MEDICAL HISTORY: Past Medical History:  Diagnosis Date   Depression    Hypertension    Migraines    Mitral regurgitation     PAST SURGICAL HISTORY: Past Surgical History:  Procedure Laterality Date   FOOT SURGERY Left    KNEE ARTHROCENTESIS Left     FAMILY HISTORY: The patient family history includes Heart disease in her mother; Stroke in her father.  SOCIAL HISTORY:  The patient  reports that she has never smoked. She has never used smokeless tobacco. She reports that she does not drink alcohol and does not use drugs.  REVIEW OF SYSTEMS: Review of Systems  Constitutional: Negative for chills and fever.  HENT:  Negative for hoarse voice and nosebleeds.   Eyes:  Negative for discharge, double vision and pain.  Cardiovascular:  Positive for chest pain. Negative for claudication, dyspnea on exertion, leg swelling, orthopnea, palpitations, paroxysmal nocturnal dyspnea and syncope.  Respiratory:  Negative for hemoptysis and shortness of breath.   Musculoskeletal:  Negative for muscle cramps and myalgias.  Gastrointestinal:  Negative for abdominal pain, constipation,  diarrhea, hematemesis, hematochezia, melena, nausea and vomiting.  Neurological:  Negative for dizziness and light-headedness.   PHYSICAL EXAM: Vitals with BMI 01/20/2021 01/15/2021 01/15/2021  Height _0  - -  Weight 150 lbs - -  BMI 63.87 - -  Systolic 564 332 951  Diastolic 80 81 884  Pulse 74 75 95   CONSTITUTIONAL: Well-developed and well-nourished. No acute distress.  SKIN: Skin is warm and dry. No rash noted. No cyanosis. No pallor. No jaundice HEAD: Normocephalic and atraumatic.  EYES: No scleral icterus MOUTH/THROAT: Moist oral membranes.  NECK: No JVD present. No thyromegaly noted. No carotid bruits  LYMPHATIC: No visible cervical adenopathy.  CHEST Normal respiratory effort. No intercostal retractions  LUNGS: Clear to auscultation bilaterally.  No stridor. No wheezes. No rales.  CARDIOVASCULAR: Regular rate and rhythm, positive Z6-S0, soft holosystolic murmur heard at the apex, no gallops or rubs ABDOMINAL: No apparent ascites.  EXTREMITIES: No peripheral edema, warm to touch bilaterally.  2+ dorsalis pedis and posterior tibial pulses. HEMATOLOGIC: No significant bruising NEUROLOGIC: Oriented to person, place, and time. Nonfocal. Normal muscle tone.  PSYCHIATRIC: Normal mood and affect. Normal behavior. Cooperative  CARDIAC  DATABASE: EKG: 01/09/2020: Normal sinus rhythm, 88 bpm, left axis deviation, poor R wave progression, LVH per voltage criteria, nonspecific T wave abnormality, without underlying injury pattern.  01/20/2021: Normal sinus rhythm, 71 bpm, left axis deviation, left anterior fascicular block, without underlying injury pattern, LVH per voltage criteria.  Echocardiogram: 01/15/2020: Normal LV systolic function with visual EF 60-65%. Left ventricle cavity is normal in size. Normal global wall motion. Indeterminate diastolic filling pattern, elevated LAP. No obvious regional wall motion abnormalities. Mild (Grade I) mitral regurgitation. Mild tricuspid  regurgitation. No evidence of pulmonary hypertension. RVSP measures 33 mmHg. Mild pulmonic regurgitation. IVC is dilated with a respiratory response of <50%. Compared to prior study dated 12/19/2019: MR and TR have improved since prior study (moderate to now mild) no other significant change.    Stress Testing: Exercise Sestamibi stress test 01/14/2020: Exercise nuclear stress test was performed using Bruce protocol. Patient reached 10.1 METS, and 93% of age predicted maximum heart rate. Exercise capacity was normal. Chest pain not reported. Heart rate and hemodynamic response were normal. Stress EKG revealed no ischemic changes. SPECT images show apical thinning, likely physiological. Stress LVEF 69%. Low risk study.   Heart Catheterization: None  LABORATORY DATA: External Labs: Collected: 12/05/2019 Creatinine 0.9 mg/dL. eGFR: 66 mL/min per 1.73 m Lipid profile: Total cholesterol 166, triglycerides 98, HDL 61, LDL 85, non HDL 105 Hemoglobin A1c: 4.9 TSH: 1.49   External Labs: Collected: 03/08/2020 Creatinine 1 mg/dL. eGFR: 58 mL/min per 1.73 m Sodium 139, potassium 4.1, chloride 101, bicarb 30, AST 13, ALT 10, alkaline phosphatase 102.  IMPRESSION:    ICD-10-CM   1. Precordial pain  R07.2 EKG 12-Lead    CT CORONARY MORPH W/CTA COR W/SCORE W/CA W/CM &/OR WO/CM    Basic metabolic panel    metoprolol tartrate (LOPRESSOR) 25 MG tablet    2. Benign hypertension  I10     3. Nonrheumatic mitral valve regurgitation  I34.0        RECOMMENDATIONS: ADDASYN MCBREEN is a 55 y.o. female whose past medical history and cardiac risk factors include: hypertension, depression, nonrheumatic mitral regurgitation.   Precordial pain EKG nonischemic. Recent ER records from 12/2020 reviewed via epic. Patient recently had an echocardiogram and nuclear stress test as noted above. Given continued symptoms of precordial pain but shared decision was to proceed with coronary CTA to evaluate  for CAD. Start Lopressor 25 mg p.o. twice daily a week prior to her coronary CTA. Check BMP prior to her coronary CTA. Patient is educated that if the symptoms increase in intensity, frequency, and/or duration she is asked to go to the closest ER via EMS for further evaluation management.  Benign hypertension Acceptable range blood pressure currently within acceptable range but not at goal. Managed by PCP. She takes all of her blood pressure medications in the evening except chlorthalidone which she takes in the morning every other day.  In the setting of lightheadedness/dizziness/near syncope and hypokalemia recommend transitioning her from chlorthalidone to possible HCTZ.  I have also recommended to take some of her blood pressure medications in the morning and some in the evening for a better blood pressure readings. Despite changing her from chlorthalidone to HCTZ if she continues to have lightheadedness/dizziness/near syncope would recommend weaning her off of Cardura. Patient states that she will discuss blood pressure medication changes and recommendations with PCP.  Nonrheumatic mitral valve regurgitation Chronic and stable. Last echocardiogram 12/2019. Patient is educated with regards to repeating an echocardiogram in 3 to 5  years from the last study or sooner if change in clinical status.  Is a part of today's office visit reviewed the recent hospitalization records as well as independent review of labs and dated10/21/2022.  FINAL MEDICATION LIST END OF ENCOUNTER: Meds ordered this encounter  Medications   metoprolol tartrate (LOPRESSOR) 25 MG tablet    Sig: Take 1 tablet (25 mg total) by mouth 2 (two) times daily for 15 days.    Dispense:  30 tablet    Refill:  0      Current Outpatient Medications:    acetaminophen (TYLENOL) 500 MG tablet, Take 1,000 mg by mouth every 6 (six) hours as needed for moderate pain or headache., Disp: , Rfl:    brexpiprazole (REXULTI) 1 MG TABS  tablet, Take 1 tablet by mouth daily., Disp: , Rfl:    chlorthalidone (HYGROTON) 25 MG tablet, Take 25 mg by mouth every other day., Disp: , Rfl:    diltiazem (TIAZAC) 240 MG 24 hr capsule, Take 240 mg by mouth at bedtime., Disp: , Rfl:    doxazosin (CARDURA) 2 MG tablet, Take 1 tablet by mouth at bedtime., Disp: , Rfl:    fluticasone furoate-vilanterol (BREO ELLIPTA) 100-25 MCG/INH AEPB, Inhale 1 puff into the lungs daily., Disp: 28 each, Rfl: 3   Galcanezumab-gnlm (EMGALITY) 120 MG/ML SOAJ, Inject 120 mg into the skin. Inject into skin as directed on pen., Disp: , Rfl:    irbesartan (AVAPRO) 300 MG tablet, Take 1 tablet by mouth at bedtime., Disp: , Rfl:    L-Methylfolate (DEPLIN) 7.5 MG TABS, Take 15 mg by mouth daily. , Disp: , Rfl:    lamoTRIgine (LAMICTAL) 150 MG tablet, Take 300 mg by mouth at bedtime., Disp: , Rfl:    linaclotide (LINZESS) 290 MCG CAPS capsule, 1 capsule as needed., Disp: , Rfl:    meclizine (ANTIVERT) 25 MG tablet, Take 1 tablet (25 mg total) by mouth every 8 (eight) hours as needed for dizziness., Disp: 15 tablet, Rfl: 0   metoprolol tartrate (LOPRESSOR) 25 MG tablet, Take 1 tablet (25 mg total) by mouth 2 (two) times daily for 15 days., Disp: 30 tablet, Rfl: 0   Multiple Vitamins-Minerals (CENTRUM VITAMINTS PO), Take 1 tablet by mouth daily., Disp: , Rfl:    potassium chloride SA (KLOR-CON) 20 MEQ tablet, Take 1 tablet (20 mEq total) by mouth 2 (two) times daily., Disp: 10 tablet, Rfl: 0   prochlorperazine (COMPAZINE) 5 MG tablet, Take 5 mg by mouth every 6 (six) hours as needed for nausea or vomiting., Disp: , Rfl:    temazepam (RESTORIL) 30 MG capsule, Take 30 mg by mouth at bedtime as needed for sleep. , Disp: , Rfl:    traZODone (DESYREL) 150 MG tablet, Take by mouth at bedtime., Disp: , Rfl:    vortioxetine HBr (TRINTELLIX) 20 MG TABS tablet, Take 1 tablet by mouth daily., Disp: , Rfl:   Orders Placed This Encounter  Procedures   CT CORONARY MORPH W/CTA COR  W/SCORE W/CA W/CM &/OR WO/CM   Basic metabolic panel   EKG 20-NOBS   There are no Patient Instructions on file for this visit.  --Continue cardiac medications as reconciled in final medication list. --Return in about 6 weeks (around 03/03/2021) for Follow up, Chest pain, Review test results. Or sooner if needed. --Continue follow-up with your primary care physician regarding the management of your other chronic comorbid conditions.  Patient's questions and concerns were addressed to her satisfaction. She voices understanding of the instructions provided  during this encounter.   This note was created using a voice recognition software as a result there may be grammatical errors inadvertently enclosed that do not reflect the nature of this encounter. Every attempt is made to correct such errors.    Rex Kras, [place], [facility]  Pager: [phone] Office: [phone]

## 2021-02-01 ENCOUNTER — Other Ambulatory Visit: Payer: Self-pay | Admitting: Internal Medicine

## 2021-02-01 DIAGNOSIS — Z1231 Encounter for screening mammogram for malignant neoplasm of breast: Secondary | ICD-10-CM

## 2021-02-01 LAB — BASIC METABOLIC PANEL
BUN/Creatinine Ratio: 19 (ref 9–23)
BUN: 18 mg/dL (ref 6–24)
CO2: 20 mmol/L (ref 20–29)
Calcium: 9.2 mg/dL (ref 8.7–10.2)
Chloride: 102 mmol/L (ref 96–106)
Creatinine, Ser: 0.94 mg/dL (ref 0.57–1.00)
Glucose: 92 mg/dL (ref 70–99)
Potassium: 3.5 mmol/L (ref 3.5–5.2)
Sodium: 145 mmol/L — ABNORMAL HIGH (ref 134–144)
eGFR: 72 mL/min/{1.73_m2} (ref >59)

## 2021-02-04 ENCOUNTER — Telehealth (HOSPITAL_COMMUNITY): Payer: Self-pay | Admitting: *Deleted

## 2021-02-04 NOTE — Telephone Encounter (Signed)
Attempted to call patient regarding upcoming cardiac CT appointment. °Left message on voicemail with name and callback number ° °Nekayla Heider RN Navigator Cardiac Imaging °[facility] °[phone] Office °[phone] Cell ° °

## 2021-02-07 ENCOUNTER — Ambulatory Visit (HOSPITAL_COMMUNITY)
Admission: RE | Admit: 2021-02-07 | Discharge: 2021-02-07 | Disposition: A | Discharge status: Home / Self Care | Payer: BC Managed Care – PPO | Source: Ambulatory Visit | Attending: Internal Medicine | Admitting: Internal Medicine

## 2021-02-07 ENCOUNTER — Other Ambulatory Visit: Payer: Self-pay

## 2021-02-07 DIAGNOSIS — R072 Precordial pain: Secondary | ICD-10-CM | POA: Diagnosis not present

## 2021-02-07 MED ORDER — NITROGLYCERIN 0.4 MG SL SUBL
SUBLINGUAL_TABLET | SUBLINGUAL | Status: AC
  Filled 2021-02-07: qty 2

## 2021-02-07 MED ORDER — NITROGLYCERIN 0.4 MG SL SUBL
0.8000 mg | SUBLINGUAL_TABLET | Freq: Once | SUBLINGUAL | Status: AC
  Administered 2021-02-07: 0.8 mg via SUBLINGUAL

## 2021-02-07 MED ORDER — IOHEXOL 350 MG/ML SOLN
95.0000 mL | Freq: Once | INTRAVENOUS | Status: AC | PRN
  Administered 2021-02-07: 95 mL via INTRAVENOUS

## 2021-02-09 DIAGNOSIS — R072 Precordial pain: Secondary | ICD-10-CM | POA: Insufficient documentation

## 2021-02-24 ENCOUNTER — Ambulatory Visit
Admission: RE | Admit: 2021-02-24 | Discharge: 2021-02-24 | Disposition: A | Discharge status: Home / Self Care | Payer: BC Managed Care – PPO | Source: Ambulatory Visit | Attending: Internal Medicine | Admitting: Internal Medicine

## 2021-02-24 ENCOUNTER — Other Ambulatory Visit: Payer: Self-pay

## 2021-02-24 DIAGNOSIS — Z1231 Encounter for screening mammogram for malignant neoplasm of breast: Secondary | ICD-10-CM

## 2021-03-03 ENCOUNTER — Encounter: Payer: Self-pay | Admitting: Cardiology

## 2021-03-03 ENCOUNTER — Ambulatory Visit: Payer: BC Managed Care – PPO | Admitting: Cardiology

## 2021-03-03 ENCOUNTER — Other Ambulatory Visit: Payer: Self-pay

## 2021-03-03 VITALS — BP 151/82 | HR 70 | Resp 16 | Ht 66.0 in | Wt 151.0 lb

## 2021-03-03 DIAGNOSIS — I340 Nonrheumatic mitral (valve) insufficiency: Secondary | ICD-10-CM

## 2021-03-03 DIAGNOSIS — R072 Precordial pain: Secondary | ICD-10-CM

## 2021-03-03 DIAGNOSIS — I10 Essential (primary) hypertension: Secondary | ICD-10-CM

## 2021-03-03 NOTE — Progress Notes (Addendum)
Date:  03/03/2021   ID:  Cynthia Roberts, DOB March 18, 1966, MRN [id number]  PCP:  Crist Infante, MD  Cardiologist:  Rex Kras, DO, [facility] (established care 01/09/2020)  Date: 03/03/21 Last Office Visit: 01/20/2021  Chief Complaint  Patient presents with   Chest Pain   Results   Follow-up    HPI  Cynthia Roberts is a 55 y.o. female who presents to the office with a chief complaint of "reevaluation of chest pain and review test results." Patient's past medical history and cardiovascular risk factors include: hypertension, depression, nonrheumatic mitral regurgitation.   She is referred to the office at the request of Crist Infante, MD for evaluation of mitral regurgitation and chest pain.  During her annual physical patient was noted to have a cardiac murmur and Referred to cardiology for further evaluation and management.  Echocardiogram noted moderate MR with better blood pressure management the severity of MR had improved to mild.  Patient was followed longitudinally and came back with symptoms of chest pain.  She underwent noninvasive testing including stress test which was reported to be low risk.  Since then she has been evaluated in the office multiple times for chest pain with reassurance.  Up until October 2022 she had gone to ED for work-up of chest pain.  During that encounter an EKG did not illustrate underlying injury pattern, high sensitive troponins were negative x2.  However, due to multiple office visits and ER visits for chest discomfort patient was concerned of having obstructive CAD and requested additional testing.  Though the pretest probability was low/intermediate and the shared decision was to proceed with coronary CTA for further evaluation.  Results of the coronary CTA discussed with both the patient and her mother at today's office visit including review of coronary CTA images in detail.  Clinically patient has not had any reoccurrence of chest pain.  Her home  blood pressures are ranging between 135-140 mmHg.  Her blood pressures are currently being managed by PCP.  ALLERGIES: Allergies  Allergen Reactions   Amlodipine Other (See Comments)    Other reaction(s): headache headcaches    Cefdinir Itching    Other reaction(s): itching    MEDICATION LIST PRIOR TO VISIT: Current Meds  Medication Sig   acetaminophen (TYLENOL) 500 MG tablet Take 1,000 mg by mouth every 6 (six) hours as needed for moderate pain or headache.   brexpiprazole (REXULTI) 1 MG TABS tablet Take 1 tablet by mouth daily.   chlorthalidone (HYGROTON) 25 MG tablet Take 25 mg by mouth every other day.   diltiazem (TIAZAC) 240 MG 24 hr capsule Take 240 mg by mouth at bedtime.   doxazosin (CARDURA) 2 MG tablet Take 1 tablet by mouth at bedtime.   Galcanezumab-gnlm (EMGALITY) 120 MG/ML SOAJ Inject 120 mg into the skin. Inject into skin as directed on pen.   irbesartan (AVAPRO) 300 MG tablet Take 1 tablet by mouth at bedtime.   L-Methylfolate (DEPLIN) 7.5 MG TABS Take 15 mg by mouth daily.    LAMOTRIGINE PO Take 300 mg by mouth at bedtime.   linaclotide (LINZESS) 290 MCG CAPS capsule 1 capsule as needed.   prochlorperazine (COMPAZINE) 5 MG tablet Take 5 mg by mouth every 6 (six) hours as needed for nausea or vomiting.   temazepam (RESTORIL) 30 MG capsule Take 30 mg by mouth at bedtime as needed for sleep.    traZODone (DESYREL) 150 MG tablet Take by mouth at bedtime.   vortioxetine HBr (TRINTELLIX) 20 MG TABS  tablet Take 20 mg by mouth daily.   [DISCONTINUED] Multiple Vitamins-Minerals (CENTRUM VITAMINTS PO) Take 1 tablet by mouth daily.     PAST MEDICAL HISTORY: Past Medical History:  Diagnosis Date   Depression    Hypertension    Migraines    Mitral regurgitation     PAST SURGICAL HISTORY: Past Surgical History:  Procedure Laterality Date   FOOT SURGERY Left    KNEE ARTHROCENTESIS Left     FAMILY HISTORY: The patient family history includes Heart disease in her  mother; Stroke in her father.  SOCIAL HISTORY:  The patient  reports that she has never smoked. She has never used smokeless tobacco. She reports that she does not drink alcohol and does not use drugs.  REVIEW OF SYSTEMS: Review of Systems  Constitutional: Negative for chills and fever.  HENT:  Negative for hoarse voice and nosebleeds.   Eyes:  Negative for discharge, double vision and pain.  Cardiovascular:  Negative for chest pain, claudication, dyspnea on exertion, leg swelling, orthopnea, palpitations, paroxysmal nocturnal dyspnea and syncope.  Respiratory:  Negative for hemoptysis and shortness of breath.   Musculoskeletal:  Negative for muscle cramps and myalgias.  Gastrointestinal:  Negative for abdominal pain, constipation, diarrhea, hematemesis, hematochezia, melena, nausea and vomiting.  Neurological:  Negative for dizziness and light-headedness.   PHYSICAL EXAM: Vitals with BMI 03/03/2021 02/07/2021 02/07/2021  Height _0  - -  Weight 151 lbs - -  BMI 76.54 - -  Systolic 650 354 656  Diastolic 82 78 75  Pulse 70 - -   CONSTITUTIONAL: Well-developed and well-nourished. No acute distress.  SKIN: Skin is warm and dry. No rash noted. No cyanosis. No pallor. No jaundice HEAD: Normocephalic and atraumatic.  EYES: No scleral icterus MOUTH/THROAT: Moist oral membranes.  NECK: No JVD present. No thyromegaly noted. No carotid bruits  LYMPHATIC: No visible cervical adenopathy.  CHEST Normal respiratory effort. No intercostal retractions  LUNGS: Clear to auscultation bilaterally.  No stridor. No wheezes. No rales.  CARDIOVASCULAR: Regular rate and rhythm, positive C1-E7, soft holosystolic murmur heard at the apex, no gallops or rubs ABDOMINAL: Nonobese, soft, nontender, nondistended, positive bowel sounds in all 4 quadrants, no apparent ascites.  EXTREMITIES: No peripheral edema, warm to touch bilaterally.  2+ dorsalis pedis and posterior tibial pulses. HEMATOLOGIC: No significant  bruising NEUROLOGIC: Oriented to person, place, and time. Nonfocal. Normal muscle tone.  PSYCHIATRIC: Normal mood and affect. Normal behavior. Cooperative  CARDIAC DATABASE: EKG: 01/09/2020: Normal sinus rhythm, 88 bpm, left axis deviation, poor R wave progression, LVH per voltage criteria, nonspecific T wave abnormality, without underlying injury pattern.  01/20/2021: Normal sinus rhythm, 71 bpm, left axis deviation, left anterior fascicular block, without underlying injury pattern, LVH per voltage criteria.  Echocardiogram: 01/15/2020: Normal LV systolic function with visual EF 60-65%. Left ventricle cavity is normal in size. Normal global wall motion. Indeterminate diastolic filling pattern, elevated LAP. No obvious regional wall motion abnormalities. Mild (Grade I) mitral regurgitation. Mild tricuspid regurgitation. No evidence of pulmonary hypertension. RVSP measures 33 mmHg. Mild pulmonic regurgitation. IVC is dilated with a respiratory response of <50%. Compared to prior study dated 12/19/2019: MR and TR have improved since prior study (moderate to now mild) no other significant change.    Stress Testing: Exercise Sestamibi stress test 01/14/2020: Exercise nuclear stress test was performed using Bruce protocol. Patient reached 10.1 METS, and 93% of age predicted maximum heart rate. Exercise capacity was normal. Chest pain not reported. Heart rate and hemodynamic response were  normal. Stress EKG revealed no ischemic changes. SPECT images show apical thinning, likely physiological. Stress LVEF 69%. Low risk study.   Coronary CTA 02/07/2021: 1. Total coronary calcium score of 0. 2. Normal coronary origin with right dominance. 3. CAD-RADS = 0 No evidence of CAD (0%). 4. No significant incidental noncardiac findings.  RECOMMENDATIONS: Consider non-atherosclerotic causes of chest pain.  Heart Catheterization: None  LABORATORY DATA: External Labs: Collected: 12/05/2019 Creatinine  0.9 mg/dL. eGFR: 66 mL/min per 1.73 m Lipid profile: Total cholesterol 166, triglycerides 98, HDL 61, LDL 85, non HDL 105 Hemoglobin A1c: 4.9 TSH: 1.49   External Labs: Collected: 03/08/2020 Creatinine 1 mg/dL. eGFR: 58 mL/min per 1.73 m Sodium 139, potassium 4.1, chloride 101, bicarb 30, AST 13, ALT 10, alkaline phosphatase 102.  IMPRESSION:    ICD-10-CM   1. Precordial pain  R07.2     2. Nonrheumatic mitral valve regurgitation  I34.0     3. Benign hypertension  I10        RECOMMENDATIONS: Cynthia Roberts is a 55 y.o. female whose past medical history and cardiac risk factors include: hypertension, depression, nonrheumatic mitral regurgitation.   Precordial pain No recurrence of chest pain since last office visit. Initially had undergone noninvasive testing which she was favorable.  However despite multiple office visits for chest pain along with ED visit in October 2022 the shared decision was to proceed with coronary CTA for further evaluation. Coronary CTA notes a total coronary calcium score of 0 and no evidence of epicardial coronary artery disease.  Images along with results were discussed with great detail with the patient and her mother during today's encounter. No additional recommendations will at this time except improving her modifiable cardiovascular risk factors. Increasing physical activity with a goal of 30 minutes a day 5 days a week as tolerated.  Nonrheumatic mitral valve regurgitation Chronic and stable. Last echocardiogram 12/2019 Severity of MR noted to be mild. Recommend follow-up echo 12/2022 or sooner if change in clinical status  Benign hypertension Office blood pressures are not well controlled. Home blood pressures are within acceptable range. We have discussed antihypertensive medications as well as administration at prior office visits. Currently blood pressure medications are being managed by PCP. Reemphasized the importance of a low-salt  diet.  FINAL MEDICATION LIST END OF ENCOUNTER: No orders of the defined types were placed in this encounter.     Current Outpatient Medications:    acetaminophen (TYLENOL) 500 MG tablet, Take 1,000 mg by mouth every 6 (six) hours as needed for moderate pain or headache., Disp: , Rfl:    brexpiprazole (REXULTI) 1 MG TABS tablet, Take 1 tablet by mouth daily., Disp: , Rfl:    chlorthalidone (HYGROTON) 25 MG tablet, Take 25 mg by mouth every other day., Disp: , Rfl:    diltiazem (TIAZAC) 240 MG 24 hr capsule, Take 240 mg by mouth at bedtime., Disp: , Rfl:    doxazosin (CARDURA) 2 MG tablet, Take 1 tablet by mouth at bedtime., Disp: , Rfl:    Galcanezumab-gnlm (EMGALITY) 120 MG/ML SOAJ, Inject 120 mg into the skin. Inject into skin as directed on pen., Disp: , Rfl:    irbesartan (AVAPRO) 300 MG tablet, Take 1 tablet by mouth at bedtime., Disp: , Rfl:    L-Methylfolate (DEPLIN) 7.5 MG TABS, Take 15 mg by mouth daily. , Disp: , Rfl:    LAMOTRIGINE PO, Take 300 mg by mouth at bedtime., Disp: , Rfl:    linaclotide (LINZESS) 290 MCG CAPS capsule,  1 capsule as needed., Disp: , Rfl:    prochlorperazine (COMPAZINE) 5 MG tablet, Take 5 mg by mouth every 6 (six) hours as needed for nausea or vomiting., Disp: , Rfl:    temazepam (RESTORIL) 30 MG capsule, Take 30 mg by mouth at bedtime as needed for sleep. , Disp: , Rfl:    traZODone (DESYREL) 150 MG tablet, Take by mouth at bedtime., Disp: , Rfl:    vortioxetine HBr (TRINTELLIX) 20 MG TABS tablet, Take 20 mg by mouth daily., Disp: , Rfl:   No orders of the defined types were placed in this encounter.  There are no Patient Instructions on file for this visit.  --Continue cardiac medications as reconciled in final medication list. --Return in about 1 year (around 03/03/2022) for Follow up MR. Or sooner if needed. --Continue follow-up with your primary care physician regarding the management of your other chronic comorbid conditions.  Patient's questions  and concerns were addressed to her satisfaction. She voices understanding of the instructions provided during this encounter.   This note was created using a voice recognition software as a result there may be grammatical errors inadvertently enclosed that do not reflect the nature of this encounter. Every attempt is made to correct such errors.  Rex Kras, [place], [facility]  Pager: [phone] Office: [phone]

## 2021-03-03 NOTE — Addendum Note (Signed)
Addended by: Oran Rein on: 03/03/2021 06:12 PM   Modules accepted: Level of Service

## 2021-03-19 ENCOUNTER — Encounter (HOSPITAL_BASED_OUTPATIENT_CLINIC_OR_DEPARTMENT_OTHER): Payer: Self-pay

## 2021-03-19 ENCOUNTER — Emergency Department (HOSPITAL_BASED_OUTPATIENT_CLINIC_OR_DEPARTMENT_OTHER)
Admission: EM | Admit: 2021-03-19 | Discharge: 2021-03-19 | Disposition: A | Discharge status: Home / Self Care | Payer: BC Managed Care – PPO | Attending: Emergency Medicine | Admitting: Emergency Medicine

## 2021-03-19 ENCOUNTER — Other Ambulatory Visit: Payer: Self-pay

## 2021-03-19 DIAGNOSIS — Z79899 Other long term (current) drug therapy: Secondary | ICD-10-CM | POA: Insufficient documentation

## 2021-03-19 DIAGNOSIS — U071 COVID-19: Secondary | ICD-10-CM

## 2021-03-19 DIAGNOSIS — I10 Essential (primary) hypertension: Secondary | ICD-10-CM | POA: Insufficient documentation

## 2021-03-19 DIAGNOSIS — R059 Cough, unspecified: Secondary | ICD-10-CM | POA: Diagnosis present

## 2021-03-19 MED ORDER — GUAIFENESIN 100 MG/5ML PO LIQD: 100.0000 mg | ORAL | 0 refills | Status: DC | PRN

## 2021-03-19 NOTE — ED Triage Notes (Addendum)
COVID + x 1 week. Pt has had cough, sore throat, vomiting, diarrhea, vomiting. Pt states she is having cough and sore throat still, along with upset stomach.   Pt took meds prescribed to her for COVID  Pt had + home test x2.

## 2021-03-19 NOTE — ED Provider Notes (Signed)
[place] EMERGENCY DEPT Provider Note   CSN: [id number] Arrival date & time: 03/19/21  1347     History Chief Complaint  Patient presents with   Covid Positive    Cynthia Roberts is a 55 y.o. female who presents to the emergency department complaining of symptoms related to [place] for the past week.  Patient had a positive at-home COVID test on 12/16.  She continues to have cough, sore throat, vomiting, diarrhea, and productive cough.  She has been taking prescribed antivirals, and Delsym with minimal relief.  No chest pain or difficulty breathing.  HPI     Past Medical History:  Diagnosis Date   Depression    Hypertension    Migraines    Mitral regurgitation     Patient Active Problem List   Diagnosis Date Noted   Precordial pain     Past Surgical History:  Procedure Laterality Date   FOOT SURGERY Left    KNEE ARTHROCENTESIS Left      OB History   No obstetric history on file.     Family History  Problem Relation Age of Onset   Heart disease Mother    Stroke Father    Breast cancer Neg Hx     Social History   Tobacco Use   Smoking status: Never   Smokeless tobacco: Never  Vaping Use   Vaping Use: Never used  Substance Use Topics   Alcohol use: No   Drug use: No    Home Medications Prior to Admission medications   Medication Sig Start Date End Date Taking? Authorizing Provider  guaiFENesin (ROBITUSSIN) 100 MG/5ML liquid Take 5-10 mLs (100-200 mg total) by mouth every 4 (four) hours as needed for cough or to loosen phlegm. 03/19/21  Yes Maicey Barrientez T, PA-C  acetaminophen (TYLENOL) 500 MG tablet Take 1,000 mg by mouth every 6 (six) hours as needed for moderate pain or headache.    [provider]  brexpiprazole (REXULTI) 1 MG TABS tablet Take 1 tablet by mouth daily.    [provider]  chlorthalidone (HYGROTON) 25 MG tablet Take 25 mg by mouth every other day. 01/20/20   [provider]  diltiazem  (TIAZAC) 240 MG 24 hr capsule Take 240 mg by mouth at bedtime.    [provider]  doxazosin (CARDURA) 2 MG tablet Take 1 tablet by mouth at bedtime. 10/19/19   [provider]  Galcanezumab-gnlm (EMGALITY) 120 MG/ML SOAJ Inject 120 mg into the skin. Inject into skin as directed on pen.    [provider]  irbesartan (AVAPRO) 300 MG tablet Take 1 tablet by mouth at bedtime.    [provider]  L-Methylfolate (DEPLIN) 7.5 MG TABS Take 15 mg by mouth daily.     [provider]  LAMOTRIGINE PO Take 300 mg by mouth at bedtime.    [provider]  linaclotide (LINZESS) 290 MCG CAPS capsule 1 capsule as needed.    [provider]  prochlorperazine (COMPAZINE) 5 MG tablet Take 5 mg by mouth every 6 (six) hours as needed for nausea or vomiting.    [provider]  temazepam (RESTORIL) 30 MG capsule Take 30 mg by mouth at bedtime as needed for sleep.     [provider]  traZODone (DESYREL) 150 MG tablet Take by mouth at bedtime.    [provider]  vortioxetine HBr (TRINTELLIX) 20 MG TABS tablet Take 20 mg by mouth daily.    [provider]  Allergies    Amlodipine and Cefdinir  Review of Systems   Review of Systems  Constitutional:  Positive for chills and fever.  HENT:  Positive for congestion and sore throat. Negative for trouble swallowing.   Respiratory:  Positive for cough. Negative for shortness of breath.   Cardiovascular:  Negative for chest pain.  Gastrointestinal:  Positive for diarrhea, nausea and vomiting. Negative for abdominal pain, blood in stool and constipation.  Genitourinary:  Negative for dysuria and flank pain.  All other systems reviewed and are negative.  Physical Exam Updated Vital Signs BP (!) 143/78 (BP Location: Right Arm)    Pulse 73    Temp 98.6 F (37 C) (Oral)    Resp 16    Ht 5\' 6"  (1.676 m)    Wt 68 kg    LMP 10/04/2014    SpO2 96%    BMI 24.21 kg/m   Physical  Exam Vitals and nursing note reviewed.  Constitutional:      Appearance: Normal appearance.  HENT:     Head: Normocephalic and atraumatic.  Eyes:     Conjunctiva/sclera: Conjunctivae normal.  Cardiovascular:     Rate and Rhythm: Normal rate and regular rhythm.  Pulmonary:     Effort: Pulmonary effort is normal. No respiratory distress.     Breath sounds: Normal breath sounds.  Abdominal:     General: There is no distension.     Palpations: Abdomen is soft.     Tenderness: There is no abdominal tenderness.  Skin:    General: Skin is warm and dry.  Neurological:     General: No focal deficit present.     Mental Status: She is alert.    ED Results / Procedures / Treatments   Labs (all labs ordered are listed, but only abnormal results are displayed) Labs Reviewed - No data to display  EKG None  Radiology No results found.  Procedures Procedures   Medications Ordered in ED Medications - No data to display  ED Course  I have reviewed the triage vital signs and the nursing notes.  Pertinent labs & imaging results that were available during my care of the patient were reviewed by me and considered in my medical decision making (see chart for details).    MDM Rules/Calculators/A&P                          Patient is a 55 year old female who presents to the emergency department complaining of COVID symptoms for the past week.  Patient had a positive at home COVID test on 12/16.  On my exam patient is afebrile, not tachycardic, not hypoxic, no acute distress.  Lung sounds clear to auscultation in all fields.  Abdomen is soft, nontender nondistended.  Overall patient is clinically well-appearing, and vital signs are reassuring.  I do not believe that she is requiring a chest x-ray at this time.  Discussed symptomatic treatment of symptoms due to COVID with patient.  She is not requiring admission or inpatient treatment for her symptoms at this time, and is stable for  discharge.  Discussed reasons return to the emergency department, patient agreeable to plan.  Final Clinical Impression(s) / ED Diagnoses Final diagnoses:  [id number]    Rx / DC Orders ED Discharge Orders          Ordered    guaiFENesin (ROBITUSSIN) 100 MG/5ML liquid  Every 4 hours PRN        03/19/21 1536  Portions of this report may have been transcribed using voice recognition software. Every effort was made to ensure accuracy; however, inadvertent computerized transcription errors may be present.    Estill Cotta 03/19/21 1641    Regan Lemming, MD 03/19/21 [phone]

## 2021-03-19 NOTE — Discharge Instructions (Addendum)
You are seen in the emergency department for symptoms related to COVID-19.  Overall your vital signs have looked reassuring, and your oxygen levels have been good.  I am prescribing you a cough syrup.   Please use Tylenol or ibuprofen for pain.  You may use 600 mg ibuprofen every 6 hours or 1000 mg of Tylenol every 6 hours.  You may choose to alternate between the 2.  This would be most effective.  Do not exceed 4 g of Tylenol within 24 hours.  Do not exceed 3200 mg ibuprofen within 24 hours.   Continue to monitor how you're doing and return to the ER for new or worsening symptoms such as difficulty breathing, fever despite medication.   It has been a pleasure seeing and caring for you today and I hope you start feeling better soon!

## 2021-03-19 NOTE — ED Notes (Signed)
ED Provider at bedside. 

## 2021-04-07 ENCOUNTER — Ambulatory Visit: Payer: BC Managed Care – PPO | Admitting: Cardiology

## 2021-09-16 ENCOUNTER — Encounter: Payer: Self-pay | Admitting: Podiatry

## 2021-09-16 ENCOUNTER — Ambulatory Visit (INDEPENDENT_AMBULATORY_CARE_PROVIDER_SITE_OTHER): Payer: BC Managed Care – PPO

## 2021-09-16 ENCOUNTER — Ambulatory Visit (INDEPENDENT_AMBULATORY_CARE_PROVIDER_SITE_OTHER): Payer: BC Managed Care – PPO | Admitting: Podiatry

## 2021-09-16 DIAGNOSIS — D169 Benign neoplasm of bone and articular cartilage, unspecified: Secondary | ICD-10-CM | POA: Diagnosis not present

## 2021-09-16 DIAGNOSIS — M722 Plantar fascial fibromatosis: Secondary | ICD-10-CM

## 2021-09-16 MED ORDER — TRIAMCINOLONE ACETONIDE 10 MG/ML IJ SUSP
20.0000 mg | Freq: Once | INTRAMUSCULAR | Status: AC
  Administered 2021-09-16: 20 mg

## 2021-09-30 ENCOUNTER — Ambulatory Visit: Payer: BC Managed Care – PPO | Admitting: Podiatry

## 2021-09-30 ENCOUNTER — Encounter: Payer: Self-pay | Admitting: Podiatry

## 2021-09-30 DIAGNOSIS — D169 Benign neoplasm of bone and articular cartilage, unspecified: Secondary | ICD-10-CM

## 2021-09-30 DIAGNOSIS — M722 Plantar fascial fibromatosis: Secondary | ICD-10-CM

## 2021-09-30 MED ORDER — TRIAMCINOLONE ACETONIDE 10 MG/ML IJ SUSP
10.0000 mg | Freq: Once | INTRAMUSCULAR | Status: AC
  Administered 2021-09-30: 10 mg

## 2021-10-01 NOTE — Progress Notes (Signed)
Subjective:   Patient ID: Cynthia Roberts, female   DOB: 56 y.o.   MRN: [id number]   HPI Patient states her heel right is still killing her and is especially bad when she gets up in the morning after periods of sitting   ROS      Objective:  Physical Exam  Neurovascular status intact with patient found to have continued acute inflammation in the plantar aspect right especially in the morning and after periods of sitting     Assessment:  Continued acute plantar fasciitis right     Plan:  Reviewed condition and explained stretching and I did apply night splint properly fitting it to her foot and lower leg and explained its usage along with ice packs I want her to use twice a day.  Sterile prep reinjected the plantar fascia 3 mg Kenalog 5 mg Xylocaine and will be seen back in 3 weeks or earlier if needed

## 2021-10-17 ENCOUNTER — Ambulatory Visit: Payer: BC Managed Care – PPO | Admitting: Pulmonary Disease

## 2021-10-20 ENCOUNTER — Emergency Department (HOSPITAL_BASED_OUTPATIENT_CLINIC_OR_DEPARTMENT_OTHER): Payer: BC Managed Care – PPO | Admitting: Radiology

## 2021-10-20 ENCOUNTER — Other Ambulatory Visit (HOSPITAL_BASED_OUTPATIENT_CLINIC_OR_DEPARTMENT_OTHER): Payer: Self-pay

## 2021-10-20 ENCOUNTER — Other Ambulatory Visit: Payer: Self-pay

## 2021-10-20 ENCOUNTER — Emergency Department (HOSPITAL_BASED_OUTPATIENT_CLINIC_OR_DEPARTMENT_OTHER)
Admission: EM | Admit: 2021-10-20 | Discharge: 2021-10-20 | Disposition: A | Discharge status: Home / Self Care | Payer: BC Managed Care – PPO | Attending: Emergency Medicine | Admitting: Emergency Medicine

## 2021-10-20 ENCOUNTER — Encounter (HOSPITAL_BASED_OUTPATIENT_CLINIC_OR_DEPARTMENT_OTHER): Payer: Self-pay

## 2021-10-20 DIAGNOSIS — I10 Essential (primary) hypertension: Secondary | ICD-10-CM | POA: Diagnosis not present

## 2021-10-20 DIAGNOSIS — K5900 Constipation, unspecified: Secondary | ICD-10-CM | POA: Diagnosis present

## 2021-10-20 DIAGNOSIS — Z79899 Other long term (current) drug therapy: Secondary | ICD-10-CM | POA: Insufficient documentation

## 2021-10-20 LAB — OCCULT BLOOD X 1 CARD TO LAB, STOOL: Fecal Occult Bld: NEGATIVE

## 2021-10-20 MED ORDER — POLYETHYLENE GLYCOL 3350 17 G PO PACK
17.0000 g | PACK | Freq: Every day | ORAL | 0 refills | Status: DC
  Filled 2021-10-20: qty 14, 14d supply, fill #0

## 2021-10-20 NOTE — ED Triage Notes (Signed)
States has not had a good bowel movement for a week.  Had very small hard stool last time yesterday.  States feels bloated but no abdominal pain.  Denies nausea or vomiting

## 2021-10-20 NOTE — ED Provider Notes (Signed)
[facility] EMERGENCY DEPT Provider Note   CSN: [id number] Arrival date & time: 10/20/21  6761     History  Chief Complaint  Patient presents with   Constipation    Cynthia Roberts is a 56 y.o. female.  Patient with a history of hypertension, depression and migraines presenting with "not having a good bowel movement" for the past 1 week.  States she normally goes every other day and does not have problems with chronic constipation.  Over the past 1 week she is only been able to pass small amounts of hard stools despite taking Colace and magnesium citrate at home.  She also takes Linzess for IBS but does not take this on a regular basis.  She feels bloated in her abdomen but denies pain.  No nausea or vomiting.  No fever.  No chest pain or shortness of breath.  Still wants to eat and drink.  Denies any black or bloody stools.  Reports she normally moves her bowels every day does not have problems with constipation normally.  No previous abdominal surgeries.  No chronic opiate medication use  The history is provided by the patient.  Constipation      Home Medications Prior to Admission medications   Medication Sig Start Date End Date Taking? Authorizing Provider  acetaminophen (TYLENOL) 500 MG tablet Take 1,000 mg by mouth every 6 (six) hours as needed for moderate pain or headache.    [provider]  brexpiprazole (REXULTI) 1 MG TABS tablet Take 1 tablet by mouth daily.    [provider]  chlorthalidone (HYGROTON) 25 MG tablet Take 25 mg by mouth every other day. 01/20/20   [provider]  diltiazem (TIAZAC) 240 MG 24 hr capsule Take 240 mg by mouth at bedtime.    [provider]  doxazosin (CARDURA) 2 MG tablet Take 1 tablet by mouth at bedtime. 10/19/19   [provider]  Galcanezumab-gnlm (EMGALITY) 120 MG/ML SOAJ Inject 120 mg into the skin. Inject into skin as directed on pen.    [provider]  irbesartan  (AVAPRO) 300 MG tablet Take 1 tablet by mouth at bedtime.    [provider]  L-Methylfolate (DEPLIN) 7.5 MG TABS Take 15 mg by mouth daily.     [provider]  LAMOTRIGINE PO Take 300 mg by mouth at bedtime.    [provider]  linaclotide (LINZESS) 290 MCG CAPS capsule 1 capsule as needed.    [provider]  temazepam (RESTORIL) 30 MG capsule Take 30 mg by mouth at bedtime as needed for sleep.     [provider]  traZODone (DESYREL) 150 MG tablet Take by mouth at bedtime.    [provider]  vortioxetine HBr (TRINTELLIX) 20 MG TABS tablet Take 20 mg by mouth daily.    [provider]      Allergies    Amlodipine and Cefdinir    Review of Systems   Review of Systems  Gastrointestinal:  Positive for constipation.    Physical Exam Updated Vital Signs BP (!) 146/76 (BP Location: Right Arm)   Pulse 80   Temp 98.7 F (37.1 C) (Oral)   Resp 12   Ht '5\' 6"'$  (1.676 m)   Wt 68.9 kg   LMP 10/04/2014   SpO2 99%   BMI 24.53 kg/m  Physical Exam Vitals and nursing note reviewed.  Constitutional:      General: She is not in acute distress.    Appearance: She  is well-developed.  HENT:     Head: Normocephalic and atraumatic.     Mouth/Throat:     Pharynx: No oropharyngeal exudate.  Eyes:     Conjunctiva/sclera: Conjunctivae normal.     Pupils: Pupils are equal, round, and reactive to light.  Neck:     Comments: No meningismus. Cardiovascular:     Rate and Rhythm: Normal rate and regular rhythm.     Heart sounds: Normal heart sounds. No murmur heard. Pulmonary:     Effort: Pulmonary effort is normal. No respiratory distress.     Breath sounds: Normal breath sounds.  Abdominal:     Palpations: Abdomen is soft.     Tenderness: There is no abdominal tenderness. There is no guarding or rebound.  Genitourinary:    Comments: Chaperone present Charlett Nose NT.  No stool in rectal vault.  No impaction Musculoskeletal:         General: No tenderness. Normal range of motion.     Cervical back: Normal range of motion and neck supple.  Skin:    General: Skin is warm.  Neurological:     Mental Status: She is alert and oriented to person, place, and time.     Cranial Nerves: No cranial nerve deficit.     Motor: No abnormal muscle tone.     Coordination: Coordination normal.     Comments:  5/5 strength throughout. CN 2-12 intact.Equal grip strength.   Psychiatric:        Behavior: Behavior normal.     ED Results / Procedures / Treatments   Labs (all labs ordered are listed, but only abnormal results are displayed) Labs Reviewed  OCCULT BLOOD X 1 CARD TO LAB, STOOL    EKG None  Radiology No results found.  Procedures Procedures    Medications Ordered in ED Medications - No data to display  ED Course/ Medical Decision Making/ A&P                           Medical Decision Making Amount and/or Complexity of Data Reviewed Labs: ordered. Decision-making details documented in ED Course. Radiology: ordered and independent interpretation performed. Decision-making details documented in ED Course. ECG/medicine tests: ordered and independent interpretation performed. Decision-making details documented in ED Course.  Constipation and abdominal bloating.  Abdomen soft, no peritoneal signs, vital stable.  No fecal impaction on exam.  No previous abdominal surgeries.  X-ray to be obtained to rule evaluate for stool burden versus obstruction  X-ray shows stool but no evidence of small bowel obstruction, no perforation or free air.  Results reviewed and interpreted by me.  On reassessment, patient is tolerating p.o., has a soft abdomen. We will initiate MiraLAX and bowel regimen and PCP follow-up.  Return to the ED with increased pain, difficulty eating or drinking, vomiting, any other concerns.        Final Clinical Impression(s) / ED Diagnoses Final diagnoses:  None    Rx / DC Orders ED  Discharge Orders     None         Cesar Alf, Annie Main, MD 10/20/21 [phone]

## 2021-10-20 NOTE — Discharge Instructions (Signed)
Your testing is reassuring.  Take MiraLAX twice daily for the next 3 days and then once daily after this.  Follow-up with your doctor.  Return to the ED with increasing pain, vomiting, not able to eat or drink or other concerns.

## 2021-10-21 ENCOUNTER — Ambulatory Visit: Payer: BC Managed Care – PPO | Admitting: Podiatry

## 2021-10-23 ENCOUNTER — Emergency Department (HOSPITAL_BASED_OUTPATIENT_CLINIC_OR_DEPARTMENT_OTHER): Payer: BC Managed Care – PPO

## 2021-10-23 ENCOUNTER — Other Ambulatory Visit: Payer: Self-pay

## 2021-10-23 ENCOUNTER — Emergency Department (HOSPITAL_BASED_OUTPATIENT_CLINIC_OR_DEPARTMENT_OTHER)
Admission: EM | Admit: 2021-10-23 | Discharge: 2021-10-23 | Disposition: A | Discharge status: Home / Self Care | Payer: BC Managed Care – PPO | Attending: Emergency Medicine | Admitting: Emergency Medicine

## 2021-10-23 ENCOUNTER — Encounter (HOSPITAL_BASED_OUTPATIENT_CLINIC_OR_DEPARTMENT_OTHER): Payer: Self-pay | Admitting: Emergency Medicine

## 2021-10-23 DIAGNOSIS — R630 Anorexia: Secondary | ICD-10-CM | POA: Diagnosis not present

## 2021-10-23 DIAGNOSIS — E876 Hypokalemia: Secondary | ICD-10-CM | POA: Insufficient documentation

## 2021-10-23 DIAGNOSIS — R110 Nausea: Secondary | ICD-10-CM | POA: Diagnosis not present

## 2021-10-23 DIAGNOSIS — K5900 Constipation, unspecified: Secondary | ICD-10-CM | POA: Diagnosis present

## 2021-10-23 LAB — COMPREHENSIVE METABOLIC PANEL
ALT: 13 U/L (ref 0–44)
AST: 15 U/L (ref 15–41)
Albumin: 5.2 g/dL — ABNORMAL HIGH (ref 3.5–5.0)
Alkaline Phosphatase: 92 U/L (ref 38–126)
Anion gap: 14 (ref 5–15)
BUN: 13 mg/dL (ref 6–20)
CO2: 29 mmol/L (ref 22–32)
Calcium: 10.5 mg/dL — ABNORMAL HIGH (ref 8.9–10.3)
Chloride: 94 mmol/L — ABNORMAL LOW (ref 98–111)
Creatinine, Ser: 0.83 mg/dL (ref 0.44–1.00)
GFR, Estimated: >60 mL/min (ref >60)
Glucose, Bld: 116 mg/dL — ABNORMAL HIGH (ref 70–99)
Potassium: 3 mmol/L — ABNORMAL LOW (ref 3.5–5.1)
Sodium: 137 mmol/L (ref 135–145)
Total Bilirubin: 1.2 mg/dL (ref 0.3–1.2)
Total Protein: 7.3 g/dL (ref 6.5–8.1)

## 2021-10-23 LAB — CBC WITH DIFFERENTIAL/PLATELET
Abs Immature Granulocytes: 0.13 10*3/uL — ABNORMAL HIGH (ref 0.00–0.07)
Basophils Absolute: 0 10*3/uL (ref 0.0–0.1)
Basophils Relative: 1 %
Eosinophils Absolute: 0 10*3/uL (ref 0.0–0.5)
Eosinophils Relative: 0 %
HCT: 38.3 % (ref 36.0–46.0)
Hemoglobin: 13.7 g/dL (ref 12.0–15.0)
Immature Granulocytes: 2 %
Lymphocytes Relative: 11 %
Lymphs Abs: 0.9 10*3/uL (ref 0.7–4.0)
MCH: 30 pg (ref 26.0–34.0)
MCHC: 35.8 g/dL (ref 30.0–36.0)
MCV: 84 fL (ref 80.0–100.0)
Monocytes Absolute: 0.4 10*3/uL (ref 0.1–1.0)
Monocytes Relative: 4 %
Neutro Abs: 7.3 10*3/uL (ref 1.7–7.7)
Neutrophils Relative %: 82 %
Platelets: 226 10*3/uL (ref 150–400)
RBC: 4.56 MIL/uL (ref 3.87–5.11)
RDW: 11.9 % (ref 11.5–15.5)
WBC: 8.8 10*3/uL (ref 4.0–10.5)
nRBC: 0 % (ref 0.0–0.2)

## 2021-10-23 LAB — URINALYSIS, ROUTINE W REFLEX MICROSCOPIC
Bilirubin Urine: NEGATIVE
Glucose, UA: NEGATIVE mg/dL
Hgb urine dipstick: NEGATIVE
Ketones, ur: NEGATIVE mg/dL
Leukocytes,Ua: NEGATIVE
Nitrite: NEGATIVE
Protein, ur: NEGATIVE mg/dL
Specific Gravity, Urine: 1.009 (ref 1.005–1.030)
pH: 7 (ref 5.0–8.0)

## 2021-10-23 LAB — LACTIC ACID, PLASMA: Lactic Acid, Venous: 1.6 mmol/L (ref 0.5–1.9)

## 2021-10-23 MED ORDER — POTASSIUM CHLORIDE 20 MEQ PO PACK
40.0000 meq | PACK | Freq: Once | ORAL | Status: AC
  Administered 2021-10-23: 40 meq via ORAL
  Filled 2021-10-23: qty 2

## 2021-10-23 MED ORDER — GOLYTELY 236 G PO SOLR: 500.0000 mL | Freq: Four times a day (QID) | ORAL | 0 refills | Status: AC

## 2021-10-23 MED ORDER — IOHEXOL 300 MG/ML  SOLN
100.0000 mL | Freq: Once | INTRAMUSCULAR | Status: AC | PRN
  Administered 2021-10-23: 100 mL via INTRAVENOUS

## 2021-10-23 NOTE — ED Provider Notes (Signed)
[facility] EMERGENC[facility] DEPT Provider Note   CSN: [id number] Arrival date & time: 10/23/21  6606     History  Chief Complaint  Patient presents with   Constipation    Cynthia Roberts is a 56 y.o. female.  The history is provided by the patient and medical records. No language interpreter was used.  Constipation Severity:  Severe Time since last bowel movement:  10 days Timing:  Constant Progression:  Worsening Chronicity:  Recurrent Stool description:  Small Relieved by:  Nothing Worsened by:  Nothing Ineffective treatments:  Miralax, laxatives and stool softeners Associated symptoms: anorexia and nausea (mild)   Associated symptoms: no abdominal pain, no back pain, no diarrhea, no dysuria, no fever, no flatus, no urinary retention and no vomiting        Home Medications Prior to Admission medications   Medication Sig Start Date End Date Taking? Authorizing Provider  acetaminophen (TYLENOL) 500 MG tablet Take 1,000 mg by mouth every 6 (six) hours as needed for moderate pain or headache.    [provider]  brexpiprazole (REXULTI) 1 MG TABS tablet Take 1 tablet by mouth daily.    [provider]  chlorthalidone (HYGROTON) 25 MG tablet Take 25 mg by mouth every other day. 01/20/20   [provider]  diltiazem (TIAZAC) 240 MG 24 hr capsule Take 240 mg by mouth at bedtime.    [provider]  doxazosin (CARDURA) 2 MG tablet Take 1 tablet by mouth at bedtime. 10/19/19   [provider]  Galcanezumab-gnlm (EMGALITY) 120 MG/ML SOAJ Inject 120 mg into the skin. Inject into skin as directed on pen.    [provider]  irbesartan (AVAPRO) 300 MG tablet Take 1 tablet by mouth at bedtime.    [provider]  L-Methylfolate (DEPLIN) 7.5 MG TABS Take 15 mg by mouth daily.     [provider]  LAMOTRIGINE PO Take 300 mg by mouth at bedtime.    [provider]  linaclotide (LINZESS) 290 MCG CAPS  capsule 1 capsule as needed.    [provider]  polyethylene glycol (MIRALAX) 17 g packet Dissolve and take 17 grams (1 packet) by mouth daily. 10/20/21   Rancour, Annie Main, MD  temazepam (RESTORIL) 30 MG capsule Take 30 mg by mouth at bedtime as needed for sleep.     [provider]  traZODone (DESYREL) 150 MG tablet Take by mouth at bedtime.    [provider]  vortioxetine HBr (TRINTELLIX) 20 MG TABS tablet Take 20 mg by mouth daily.    [provider]      Allergies    Amlodipine and Cefdinir    Review of Systems   Review of Systems  Constitutional:  Negative for chills, diaphoresis, fatigue and fever.  HENT:  Negative for congestion.   Respiratory:  Negative for cough, chest tightness, shortness of breath and wheezing.   Gastrointestinal:  Positive for abdominal distention, anorexia, constipation and nausea (mild). Negative for abdominal pain, diarrhea, flatus and vomiting.  Genitourinary:  Negative for decreased urine volume, difficulty urinating, dysuria, flank pain, frequency, hematuria, pelvic pain, vaginal bleeding, vaginal discharge and vaginal pain.  Musculoskeletal:  Negative for back pain, neck pain and neck stiffness.  Skin:  Negative for wound.  Neurological:  Negative for light-headedness and headaches.  Psychiatric/Behavioral:  Negative for agitation.   All other systems reviewed and are negative.   Physical Exam Updated Vital Signs BP (!) 175/94   Pulse 89   Temp 99  F (37.2 C) (Oral)   Resp 16   LMP 10/04/2014   SpO2 100%  Physical Exam Vitals and nursing note reviewed.  Constitutional:      General: She is not in acute distress.    Appearance: She is well-developed. She is not ill-appearing, toxic-appearing or diaphoretic.  HENT:     Head: Normocephalic and atraumatic.     Nose: No congestion or rhinorrhea.     Mouth/Throat:     Mouth: Mucous membranes are moist.     Pharynx: No oropharyngeal exudate or posterior  oropharyngeal erythema.  Eyes:     Extraocular Movements: Extraocular movements intact.     Conjunctiva/sclera: Conjunctivae normal.     Pupils: Pupils are equal, round, and reactive to light.  Cardiovascular:     Rate and Rhythm: Normal rate and regular rhythm.     Heart sounds: No murmur heard. Pulmonary:     Effort: Pulmonary effort is normal. No respiratory distress.     Breath sounds: Normal breath sounds. No wheezing, rhonchi or rales.  Chest:     Chest wall: No tenderness.  Abdominal:     General: There is distension.     Palpations: Abdomen is soft.     Tenderness: There is no abdominal tenderness. There is no right CVA tenderness, left CVA tenderness, guarding or rebound.  Musculoskeletal:        General: No swelling or tenderness.     Cervical back: Neck supple.  Skin:    General: Skin is warm and dry.     Capillary Refill: Capillary refill takes less than 2 seconds.     Findings: No erythema.  Neurological:     General: No focal deficit present.     Mental Status: She is alert.  Psychiatric:        Mood and Affect: Mood normal.     ED Results / Procedures / Treatments   Labs (all labs ordered are listed, but only abnormal results are displayed) Labs Reviewed  CBC WITH DIFFERENTIAL/PLATELET - Abnormal; Notable for the following components:      Result Value   Abs Immature Granulocytes 0.13 (*)    All other components within normal limits  COMPREHENSIVE METABOLIC PANEL - Abnormal; Notable for the following components:   Potassium 3.0 (*)    Chloride 94 (*)    Glucose, Bld 116 (*)    Calcium 10.5 (*)    Albumin 5.2 (*)    All other components within normal limits  LACTIC ACID, PLASMA  URINALYSIS, ROUTINE W REFLEX MICROSCOPIC    EKG None  Radiology CT ABDOMEN PELVIS W CONTRAST  Result Date: 10/23/2021 CLINICAL DATA:  Bowel obstruction suspected. No bowel movement in over a week, abdominal distention. X-ray did not show obstruction but symptoms are  persistent and worsening EXAM: CT ABDOMEN AND PELVIS WITH CONTRAST TECHNIQUE: Multidetector CT imaging of the abdomen and pelvis was performed using the standard protocol following bolus administration of intravenous contrast. RADIATION DOSE REDUCTION: This exam was performed according to the departmental dose-optimization program which includes automated exposure control, adjustment of the mA and/or kV according to patient size and/or use of iterative reconstruction technique. CONTRAST:  148m OMNIPAQUE IOHEXOL 300 MG/ML  SOLN COMPARISON:  None Available. FINDINGS: Lower chest: No acute abnormality. Hepatobiliary: No focal liver abnormality is seen. No gallstones, gallbladder wall thickening, or biliary dilatation. Pancreas: Unremarkable. No pancreatic ductal dilatation or surrounding inflammatory changes. Spleen: Normal in size without focal abnormality. Adrenals/Urinary Tract: Adrenal glands are unremarkable. Kidneys are  normal, without renal calculi, focal lesion, or hydronephrosis. Bladder is unremarkable. Stomach/Bowel: Small hiatal hernia. Stomach is within normal limits. Appendix is not discretely delineated. No pericecal inflammatory stranding. No evidence of bowel wall thickening, distention, or inflammatory changes. Mild diverticulosis of the colon more so of the sigmoid colon without diverticulitis. Moderate stool burden in the colon. Vascular/Lymphatic: No significant vascular findings are present. No enlarged abdominal or pelvic lymph nodes. Reproductive: Uterus and right adnexa are unremarkable. There is cystic structure seen at the left adnexa measuring 4.8 x 3.7 x 3.1 cm Other: No abdominal wall hernia or abnormality. No abdominopelvic ascites. Musculoskeletal: No acute or significant osseous findings. IMPRESSION: 1. Small hiatal hernia. Bowel-gas pattern is nonobstructive. Mild diverticulosis of the colon without diverticulitis. Moderate stool burden in the colon. 2. 4.8 x 3.7 x 3.1 cm simple  appearing cystic structure at the left adnexa. Recommend follow-up pelvic US in 6-12 months. Reference: JACR 2020 Feb;17(2):248-254 Electronically Signed   By: Frazier Richards M.D.   On: 10/23/2021 11:15    Procedures Procedures    Medications Ordered in ED Medications  iohexol (OMNIPAQUE) 300 MG/ML solution 100 mL (100 mLs Intravenous Contrast Given 10/23/21 1052)  potassium chloride (KLOR-CON) packet 40 mEq (40 mEq Oral Given 10/23/21 1520)    ED Course/ Medical Decision Making/ A&P                           Medical Decision Making Amount and/or Complexity of Data Reviewed Labs: ordered. Radiology: ordered.  Risk Prescription drug management.    Cynthia Roberts is a 56 y.o. female with a past medical history significant for hypertension, migraines, depression, and previous constipation who presents with abdominal distention and constipation.  Patient reports that she was seen several days ago for not having bowel movements and constipation and had an x-ray that was reassuring.  She was told to increase her MiraLAX and try other things.  She reports she has done that with MiraLAX, she has taken mag citrate, used stool softeners, and has still not had any bowel movements.  She had a very small amount of loose stool but her abdomen keeps distending.  She reports some nausea and now no appetite.  She reports no fevers, chills, chest pain, shortness of breath and is denying significant abdominal pain back pain or flank pain.  She denies any urinary changes and no vaginal symptoms.  She reports that she is worried about how much more distended her abdomen is becoming.  She has not yet tried enemas and does not feel the sensation of a hard large stool that she cannot pass.  She has no urge to defecate.  On exam, lungs clear and chest nontender.  Abdomen was nontender but was slightly distended.  Bowel sounds were appreciated.  Flanks and back nontender.  Lungs clear.  Patient otherwise  well-appearing.  Due to the patient's worsening distention of the abdomen and lack of bowel movements despite all the different treatments initially tried, we will get a CT scan to rule out ileus or obstruction.  We will get other screening labs as well.  If work-up is reassuring and this is just constipation, anticipate trying a enema or other intervention.  Anticipate reassessment after work-up.  CT scan findings were discussed with the patient.  There was a likely benign ovarian cyst which she needs to follow-up ultrasounds about which we told her.  There was also diverticulosis but no diverticulitis.  Patient had  a small hiatal hernia with we discussed with there is no evidence of acute obstruction inflammation or severe stool burden.  She is of a moderate stool burden.  Patient was found to have a low potassium and hypercalcemia which may have injured some of her symptoms.  We offered her oral potassium supplementation for which she requested the powder which we gave her dose of and then she will do oral hydration instead of IV hydration for hypercalcemia.  Patient requested a prescription for GoLytely as that is the only thing that seem to fix her last time she had constipation.  We discussed that she needs to make sure she is maintaining hydration and electrolyte balance if she uses this but she wanted to try it.  We gave her prescription for several doses and she can follow-up with her PCP and gastroenterologist.  Patient agrees with plan of care and had no other questions or concerns and was discharged in good condition.         Final Clinical Impression(s) / ED Diagnoses Final diagnoses:  Constipation, unspecified constipation type  Hypokalemia  Hypercalcemia    Rx / DC Orders ED Discharge Orders          Ordered    polyethylene glycol (GOLYTELY) 236 g solution  Every 6 hours        10/23/21 1516            Clinical Impression: 1. Constipation, unspecified constipation  type   2. Hypokalemia   3. Hypercalcemia     Disposition: Discharge  Condition: Good  I have discussed the results, Dx and Tx plan with the pt(& family if present). He/she/they expressed understanding and agree(s) with the plan. Discharge instructions discussed at great length. Strict return precautions discussed and pt &/or family have verbalized understanding of the instructions. No further questions at time of discharge.    Discharge Medication List as of 10/23/2021  3:33 PM     START taking these medications   Details  polyethylene glycol (GOLYTELY) 236 g solution Take 500 mLs by mouth every 6 (six) hours for 4 doses., Starting Sun 10/23/2021, Until Mon 10/24/2021, Print        Follow Up: [address], [place] [address] [phone]         Kelia Gibbon, Gwenyth Allegra, MD 10/23/21 [phone]

## 2021-10-23 NOTE — Discharge Instructions (Addendum)
Your history, exam, work-up today did not show evidence of acute obstruction or other concerning findings in your abdomen and pelvis imaging however your labs did show evidence of low potassium recurrence and elevated calcium.  I suspect these electrolyte abnormalities may be contributing to your symptoms of constipation.  Please push hydration and increase your potassium intake.  As we discussed, I gave you a prescription for the GoLytely to help move your bowels along although please make sure you are bleeding with adequate fluids and potassium rich intake.  Please follow-up with your PCP for further management.  If any symptoms change or worsen acutely, please return to the nearest emergency department.

## 2021-10-23 NOTE — ED Triage Notes (Signed)
Pt was here [place] to double up on miralax. Pts states has not helped. Has not tried enema for some reason. No pain,just fullness

## 2021-10-23 NOTE — ED Notes (Signed)
Dc instructions reviewed with patient. Patient voiced understanding. Dc with belongings.  °

## 2021-11-02 ENCOUNTER — Encounter: Payer: Self-pay | Admitting: Internal Medicine

## 2021-11-09 ENCOUNTER — Ambulatory Visit: Payer: BC Managed Care – PPO | Admitting: Podiatry

## 2021-11-22 ENCOUNTER — Ambulatory Visit: Payer: BC Managed Care – PPO | Admitting: Pulmonary Disease

## 2021-12-06 ENCOUNTER — Encounter: Payer: Self-pay | Admitting: Internal Medicine

## 2021-12-06 ENCOUNTER — Ambulatory Visit (INDEPENDENT_AMBULATORY_CARE_PROVIDER_SITE_OTHER): Payer: BC Managed Care – PPO | Admitting: Internal Medicine

## 2021-12-06 VITALS — BP 134/84 | HR 71 | Ht 64.57 in | Wt 153.1 lb

## 2021-12-06 DIAGNOSIS — R1013 Epigastric pain: Secondary | ICD-10-CM

## 2021-12-06 DIAGNOSIS — Z1211 Encounter for screening for malignant neoplasm of colon: Secondary | ICD-10-CM

## 2021-12-06 DIAGNOSIS — K449 Diaphragmatic hernia without obstruction or gangrene: Secondary | ICD-10-CM

## 2021-12-06 DIAGNOSIS — K5900 Constipation, unspecified: Secondary | ICD-10-CM

## 2021-12-06 MED ORDER — DICYCLOMINE HCL 10 MG PO CAPS: 10.0000 mg | ORAL_CAPSULE | Freq: Three times a day (TID) | ORAL | 0 refills | Status: DC | PRN

## 2021-12-06 NOTE — Progress Notes (Signed)
Chief Complaint: Constipation, ab pain  HPI : 56 year old female with history of migraines and depression presents with constipation and ab pain  She has had ab pain for the last year that has been off-and-on. The ab pain has been worsening over time. She has the ab pain every couple of weeks when she has a couple of days of pain. She has not taken the Linzess for the last couple of weeks, and she has not experienced any ab pain over that period of time. Thus she does wonder if the Linzess may be contributing to her ab pain. Her ab pain is located in the epigastric area and sometimes she has to stretch out her stomach to make it feel better. The pain feels like a dull burning sensation. She has not noticed much of a difference with passing BMs and eating. Endorses nausea. Denies vomiting. Endorses chest burning and regurgitation. She takes pantoprazole PRN for acid reflux. She has had constipation for years. She has BMs about 2-3 times per week. She has been taking Align probiotic, which has helped. She has used Miralax in the past, which has helped when she was hospitalized for constipation. Denies blood in stools. Denies prior colonoscopy or EGD. Her cousins have had pancreatic and colon cancer. Grandfather had stomach and liver cancer. Denies NSAIDs. Denies dysphagia per patient intake form. Patient feels like she hydrates well and does get regular physical activity.   Past Medical History:  Diagnosis Date   Anxiety    Depression    Hiatal hernia    Hypertension    Migraines    Mitral regurgitation    Ovarian cyst    left     Past Surgical History:  Procedure Laterality Date   BUNIONECTOMY Left    KNEE ARTHROCENTESIS Left    Family History  Problem Relation Age of Onset   Heart disease Mother    Hypertension Mother    Asthma Mother    Stroke Father    Hypertension Father    Parkinson's disease Brother    Uterine cancer Maternal Grandmother        mets to lung   Hypertension  Maternal Grandmother    Lung cancer Maternal Grandmother    Liver cancer Maternal Grandfather    Stomach cancer Maternal Grandfather    Diabetes Maternal Grandfather    Pancreatic cancer Cousin    Colon cancer Cousin    Breast cancer Neg Hx    Social History   Tobacco Use   Smoking status: Never   Smokeless tobacco: Never  Vaping Use   Vaping Use: Never used  Substance Use Topics   Alcohol use: No   Drug use: No   Current Outpatient Medications  Medication Sig Dispense Refill   acetaminophen (TYLENOL) 500 MG tablet Take 1,000 mg by mouth every 6 (six) hours as needed for moderate pain or headache.     brexpiprazole (REXULTI) 1 MG TABS tablet Take 1 tablet by mouth daily.     chlorthalidone (HYGROTON) 25 MG tablet Take 25 mg by mouth every other day.     diltiazem (TIAZAC) 240 MG 24 hr capsule Take 240 mg by mouth at bedtime.     doxazosin (CARDURA) 2 MG tablet Take 1 tablet by mouth at bedtime.     eletriptan (RELPAX) 40 MG tablet Take 40 mg by mouth as needed for migraine or headache. May repeat in 2 hours if headache persists or recurs.     Esketamine HCl (SPRAVATO, 84  MG DOSE, NA) Place 1 spray into the nose as needed.     Galcanezumab-gnlm (EMGALITY) 120 MG/ML SOAJ Inject 120 mg into the skin every 30 (thirty) days. Inject into skin as directed on pen.     irbesartan (AVAPRO) 300 MG tablet Take 1 tablet by mouth at bedtime.     L-Methylfolate 15 MG TABS Take 1 tablet by mouth daily.     LAMOTRIGINE PO Take 300 mg by mouth at bedtime.     linaclotide (LINZESS) 145 MCG CAPS capsule Take 145 mcg by mouth as needed.     OnabotulinumtoxinA (BOTOX IJ) Inject 1 Dose as directed every 30 (thirty) days.     ondansetron (ZOFRAN-ODT) 4 MG disintegrating tablet Take 4 mg by mouth as needed for nausea or vomiting.     pantoprazole (PROTONIX) 40 MG tablet Take 40 mg by mouth as needed.     temazepam (RESTORIL) 15 MG capsule Take 15 mg by mouth as needed for sleep.     traZODone (DESYREL)  150 MG tablet Take by mouth at bedtime.     vortioxetine HBr (TRINTELLIX) 20 MG TABS tablet Take 20 mg by mouth daily.     vortioxetine HBr (TRINTELLIX) 5 MG TABS tablet Take 5 mg by mouth daily.     No current facility-administered medications for this visit.   Allergies  Allergen Reactions   Cefdinir Itching    Other reaction(s): itching   Norvasc [Amlodipine] Other (See Comments)    Other reaction(s): headache headcaches      Review of Systems: All systems reviewed and negative except where noted in HPI.   Physical Exam: BP 134/84 (BP Location: Left Arm, Patient Position: Sitting, Cuff Size: Normal)   Pulse 71   Ht 5' 4.57" (1.64 m) Comment: wihtout shoes  Wt 153 lb 2 oz (69.5 kg)   LMP 10/04/2014   BMI 25.82 kg/m  Constitutional: Pleasant,well-developed, female in no acute distress. HEENT: Normocephalic and atraumatic. Conjunctivae are normal. No scleral icterus. Cardiovascular: Normal rate, regular rhythm.  Pulmonary/chest: Effort normal and breath sounds normal. No wheezing, rales or rhonchi. Abdominal: Soft, nondistended, nontender. Bowel sounds active throughout. There are no masses palpable. No hepatomegaly. Extremities: No edema Neurological: Alert and oriented to person place and time. Skin: Skin is warm and dry. No rashes noted. Psychiatric: Normal mood and affect. Behavior is normal.  Labs 11/2020: TSH nml.   Labs 09/2021: CBC nml. CMP with low potassium of 3. FOBT negative.  TTE 01/15/20: Echocardiogram 01/15/2020:  Normal LV systolic function with visual EF 60-65%. Left ventricle cavity  is normal in size. Normal global wall motion. Indeterminate diastolic  filling pattern, elevated LAP. No obvious regional wall motion  abnormalities.  Mild (Grade I) mitral regurgitation.  Mild tricuspid regurgitation. No evidence of pulmonary hypertension. RVSP  measures 33 mmHg.  Mild pulmonic regurgitation.  IVC is dilated with a respiratory response of <50%.    KUB 10/20/21: IMPRESSION: Negative abdominal radiographs.  No acute cardiopulmonary disease.  CT A/P w/contrast 10/23/21: IMPRESSION: 1. Small hiatal hernia. Bowel-gas pattern is nonobstructive. Mild diverticulosis of the colon without diverticulitis. Moderate stool burden in the colon. 2. 4.8 x 3.7 x 3.1 cm simple appearing cystic structure at the left adnexa. Recommend follow-up pelvic US in 6-12 months. Reference: JACR 2020 Feb;17(2):248-254  ASSESSMENT AND PLAN: Constipation Epigastric ab pain Hiatal hernia Colon cancer screening Patient presents with epigastric ab pain over the last year that has been worsening over time. She also describe longstanding constipation. Her epigastric ab  pain could be due to PUD, esophagitis/gastritis, uncontrolled GERD, gallstones, or constipation. Her recent CT scan shows that she has a small hiatal hernia that would predispose her to reflux issues and constipation as well. Will plan for further work up of her epigastric ab pain with RUQ U/S and EGD. Since patient would be getting sedation for an EGD, will update her colon cancer screening by performing a colonoscopy. Will also go ahead and institute constipation therapies. - Check RUQ U/S - Daily fiber supplement - Miralax QD. Titrate up to 3 times per day - Bentyl PRN - EGD/colonoscopy [facility]  [name], MD

## 2021-12-06 NOTE — Patient Instructions (Addendum)
If you are age 55 or younger, your body mass index should be between 19-25. Your Body mass index is 25.82 kg/m. If this is out of the aformentioned range listed, please consider follow up with your Primary Care Provider.   ________________________________________________________  The [facility] providers would like to encourage you to use [facility] to communicate with providers for non-urgent requests or questions.  Due to long hold times on the telephone, sending your provider a message by [facility] may be a faster and more efficient way to get a response.  Please allow 48 business hours for a response.  Please remember that this is for non-urgent requests.  _______________________________________________________  Cynthia Roberts have been scheduled for an endoscopy and colonoscopy. Please follow the written instructions given to you at your visit today. Please pick up your prep supplies at the pharmacy within the next 1-3 days. If you use inhalers (even only as needed), please bring them with you on the day of your procedure.  You have been scheduled for an abdominal ultrasound at [facility] (1st floor of hospital) on 12-09-21 at 8:30am. Please arrive 30 minutes prior to your appointment for registration. Make certain not to have anything to eat or drink after midnight prior to your appointment. Should you need to reschedule your appointment, please contact radiology at [phone]. This test typically takes about 30 minutes to perform.  Please purchase the following medications over the counter and take as directed: Fiber supplement such as Benefiber- use as directed daily Miralax: Take as  directed up to 3 times a day to achieve regular bowel movements  We have sent the following medications to your pharmacy for you to pick up at your convenience:  START: Bentyl '10mg'$  one tablet three times daily as needed.  Due to recent changes in healthcare laws, you may see the results of your imaging and  laboratory studies on MyChart before your provider has had a chance to review them.  We understand that in some cases there may be results that are confusing or concerning to you. Not all laboratory results come back in the same time frame and the provider may be waiting for multiple results in order to interpret others.  Please give [place] 48 hours in order for your provider to thoroughly review all the results before contacting the office for clarification of your results.   Thank you for entrusting me with your care and choosing [facility].  Dr [name]

## 2021-12-09 ENCOUNTER — Ambulatory Visit (HOSPITAL_COMMUNITY)
Admission: RE | Admit: 2021-12-09 | Discharge: 2021-12-09 | Disposition: A | Discharge status: Home / Self Care | Payer: BC Managed Care – PPO | Source: Ambulatory Visit | Attending: Internal Medicine | Admitting: Internal Medicine

## 2021-12-09 DIAGNOSIS — R1013 Epigastric pain: Secondary | ICD-10-CM

## 2021-12-28 ENCOUNTER — Encounter: Payer: Self-pay | Admitting: Internal Medicine

## 2022-01-02 ENCOUNTER — Telehealth: Payer: Self-pay | Admitting: Internal Medicine

## 2022-01-02 NOTE — Telephone Encounter (Signed)
Inbound call from patient wondering if she can take her temazepam before her procedure Wednesday. Please advise.

## 2022-01-03 ENCOUNTER — Telehealth: Payer: Self-pay | Admitting: Gastroenterology

## 2022-01-03 NOTE — Telephone Encounter (Signed)
Patient contacted and advised. No further questions.

## 2022-01-03 NOTE — Telephone Encounter (Signed)
For EGD/colonoscopy at 130 tomorrow.  After taking Dulcolax and then drinking Gatorade/MiraLAX, she threw up "everything". However, she is having some diarrhea.  She has taken magnesium citrate in past  She will take 1/2 bottle of magnesium citrate tonight. She will drink the rest of the Gatorade/MiraLAX in a.m.  I think she will be okay for EGD/colon tomorrow She has my cell and will call directly if any problems overnight  RG

## 2022-01-04 ENCOUNTER — Telehealth: Payer: Self-pay | Admitting: Internal Medicine

## 2022-01-04 ENCOUNTER — Other Ambulatory Visit: Payer: Self-pay | Admitting: Internal Medicine

## 2022-01-04 ENCOUNTER — Encounter: Payer: Self-pay | Admitting: Internal Medicine

## 2022-01-04 ENCOUNTER — Ambulatory Visit (AMBULATORY_SURGERY_CENTER): Payer: BC Managed Care – PPO | Admitting: Internal Medicine

## 2022-01-04 VITALS — BP 136/82 | HR 74 | Temp 98.4°F | Resp 13 | Ht 64.0 in | Wt 153.0 lb

## 2022-01-04 DIAGNOSIS — K635 Polyp of colon: Secondary | ICD-10-CM | POA: Diagnosis not present

## 2022-01-04 DIAGNOSIS — K5900 Constipation, unspecified: Secondary | ICD-10-CM

## 2022-01-04 DIAGNOSIS — D125 Benign neoplasm of sigmoid colon: Secondary | ICD-10-CM

## 2022-01-04 DIAGNOSIS — K449 Diaphragmatic hernia without obstruction or gangrene: Secondary | ICD-10-CM | POA: Diagnosis not present

## 2022-01-04 DIAGNOSIS — K222 Esophageal obstruction: Secondary | ICD-10-CM | POA: Diagnosis not present

## 2022-01-04 DIAGNOSIS — K2100 Gastro-esophageal reflux disease with esophagitis, without bleeding: Secondary | ICD-10-CM | POA: Diagnosis not present

## 2022-01-04 DIAGNOSIS — Z1211 Encounter for screening for malignant neoplasm of colon: Secondary | ICD-10-CM | POA: Diagnosis present

## 2022-01-04 DIAGNOSIS — R1013 Epigastric pain: Secondary | ICD-10-CM

## 2022-01-04 MED ORDER — PANTOPRAZOLE SODIUM 40 MG PO TBEC: 40.0000 mg | DELAYED_RELEASE_TABLET | Freq: Two times a day (BID) | ORAL | 1 refills | Status: DC

## 2022-01-04 MED ORDER — SODIUM CHLORIDE 0.9 % IV SOLN: 500.0000 mL | Freq: Once | INTRAVENOUS | Status: DC

## 2022-01-04 NOTE — Op Note (Signed)
[place] Patient Name: Cynthia Roberts Procedure Date: 01/04/2022 1:23 PM MRN: [id number] Endoscopist: Sonny Masters "[name] ,  Age: 56 Referring MD:  Date of Birth: 05/15/1965 Gender: Female Account #: [id number] Procedure:                Upper GI endoscopy Indications:              Epigastric abdominal pain Medicines:                Monitored Anesthesia Care Procedure:                Pre-Anesthesia Assessment:                           - Prior to the procedure, a History and Physical                            was performed, and patient medications and                            allergies were reviewed. The patient's tolerance of                            previous anesthesia was also reviewed. The risks                            and benefits of the procedure and the sedation                            options and risks were discussed with the patient.                            All questions were answered, and informed consent                            was obtained. Prior Anticoagulants: The patient has                            taken no previous anticoagulant or antiplatelet                            agents. ASA Grade Assessment: II - A patient with                            mild systemic disease. After reviewing the risks                            and benefits, the patient was deemed in                            satisfactory condition to undergo the procedure.                           After obtaining informed consent, the endoscope was  passed under direct vision. Throughout the                            procedure, the patient's blood pressure, pulse, and                            oxygen saturations were monitored continuously. The                            GIF [id number] #[id number] was introduced through the                            mouth, and advanced to the second part of duodenum.                            The upper GI endoscopy  was accomplished without                            difficulty. The patient tolerated the procedure                            well. Scope In: Scope Out: Findings:                 LA Grade A (one or more mucosal breaks less than 5                            mm, not extending between tops of 2 mucosal folds)                            esophagitis with no bleeding was found in the                            distal esophagus.                           One benign-appearing, intrinsic moderate                            (circumferential scarring or stenosis; an endoscope                            may pass) stenosis was found at the                            gastroesophageal junction. This stenosis measured                            less than one cm (in length). The stenosis was                            traversed. Biopsies were taken with a cold forceps                            for histology.  A medium-sized hiatal hernia was present.                           Localized mildly erythematous mucosa without                            bleeding was found in the gastric antrum. Biopsies                            were taken with a cold forceps for histology.                           The examined duodenum was normal. Complications:            No immediate complications. Estimated Blood Loss:     Estimated blood loss was minimal. Impression:               - LA Grade A reflux esophagitis with no bleeding.                           - Benign-appearing esophageal stenosis. Biopsied.                           - Medium-sized hiatal hernia.                           - Erythematous mucosa in the antrum. Biopsied.                           - Normal examined duodenum. Recommendation:           - Await pathology results.                           - Use Protonix (pantoprazole) 40 mg PO BID for 8                            weeks.                           - Perform a colonoscopy  today. Dr Georgian Co "[name]" [name],  01/04/2022 1:59:57 PM

## 2022-01-04 NOTE — Patient Instructions (Signed)
Handout on polys, diverticulosis, hemorrhoids, stricture, esophagitis, and hiatal hernia given to patient.  Await pathology results. Take Protonix (Pantoprazole) 40 mg twice a day for 8 weeks Follow-up in GI clinic in 6 weeks  Resume previous diet and continue present medications. Repeat colonoscopy will be determined based off of pathology results.   YOU HAD AN ENDOSCOPIC PROCEDURE TODAY AT [facility]:   Refer to the procedure report that was given to you for any specific questions about what was found during the examination.  If the procedure report does not answer your questions, please call your gastroenterologist to clarify.  If you requested that your care partner not be given the details of your procedure findings, then the procedure report has been included in a sealed envelope for you to review at your convenience later.  YOU SHOULD EXPECT: Some feelings of bloating in the abdomen. Passage of more gas than usual.  Walking can help get rid of the air that was put into your GI tract during the procedure and reduce the bloating. If you had a lower endoscopy (such as a colonoscopy or flexible sigmoidoscopy) you may notice spotting of blood in your stool or on the toilet paper. If you underwent a bowel prep for your procedure, you may not have a normal bowel movement for a few days.  Please Note:  You might notice some irritation and congestion in your nose or some drainage.  This is from the oxygen used during your procedure.  There is no need for concern and it should clear up in a day or so.  SYMPTOMS TO REPORT IMMEDIATELY:  Following lower endoscopy (colonoscopy or flexible sigmoidoscopy):  Excessive amounts of blood in the stool  Significant tenderness or worsening of abdominal pains  Swelling of the abdomen that is new, acute  Fever of 100F or higher  Following upper endoscopy (EGD)  Vomiting of blood or coffee ground material  New chest pain or pain under the  shoulder blades  Painful or persistently difficult swallowing  New shortness of breath  Fever of 100F or higher  Black, tarry-looking stools  For urgent or emergent issues, a gastroenterologist can be reached at any hour by calling [phone]. Do not use MyChart messaging for urgent concerns.    DIET:  We do recommend a small meal at first, but then you may proceed to your regular diet.  Drink plenty of fluids but you should avoid alcoholic beverages for 24 hours.  ACTIVITY:  You should plan to take it easy for the rest of today and you should NOT DRIVE or use heavy machinery until tomorrow (because of the sedation medicines used during the test).    FOLLOW UP: Our staff will call the number listed on your records the next business day following your procedure.  We will call around 7:15- 8:00 am to check on you and address any questions or concerns that you may have regarding the information given to you following your procedure. If we do not reach you, we will leave a message.     If any biopsies were taken you will be contacted by phone or by letter within the next 1-3 weeks.  Please call us at [phone] if you have not heard about the biopsies in 3 weeks.    SIGNATURES/CONFIDENTIALITY: You and/or your care partner have signed paperwork which will be entered into your electronic medical record.  These signatures attest to the fact that that the information above on your After  Visit Summary has been reviewed and is understood.  Full responsibility of the confidentiality of this discharge information lies with you and/or your care-partner.

## 2022-01-04 NOTE — Progress Notes (Signed)
1315 Robinul 0.1 mg IV given due large amount of secretions upon assessment.  MD made aware, vss  

## 2022-01-04 NOTE — Telephone Encounter (Signed)
Returned patient call.  Patient stated she has some sediment in BM but it is clear and she can see to the bottom of the toilet.  I told her it was ok to proceed and the sediment may clear out before then.  As long as her stool wasn't cloudy and brown.  Patient verbalized understanding and will come at 12:30 today for her procedure.

## 2022-01-04 NOTE — Telephone Encounter (Signed)
Inbound call from patient states she is still having clear diarrhea. She called after hours last night and spoke with Dr. Lyndel Safe but states she is still having issues. Her procedure is today. Please advise.

## 2022-01-04 NOTE — Progress Notes (Signed)
Pt's states no medical or surgical changes since previsit or office visit. 

## 2022-01-04 NOTE — Progress Notes (Signed)
Called to room to assist during endoscopic procedure.  Patient ID and intended procedure confirmed with present staff. Received instructions for my participation in the procedure from the performing physician.  

## 2022-01-04 NOTE — Progress Notes (Signed)
GASTROENTEROLOGY PROCEDURE H&P NOTE   Primary Care Physician: Crist Infante, MD    Reason for Procedure:   Epigastric ab pain, colon cancer screening  Plan:    EGD/colonoscopy  Patient is appropriate for endoscopic procedure(s) in the ambulatory ([facility]) setting.  The nature of the procedure, as well as the risks, benefits, and alternatives were carefully and thoroughly reviewed with the patient. Ample time for discussion and questions allowed. The patient understood, was satisfied, and agreed to proceed.     HPI: Cynthia Roberts is a 56 y.o. female who presents for EGD/colonoscopy for evaluation of epigastric ab pain and colon cancer screening .  Patient was most recently seen in the Gastroenterology Clinic on 12/06/21.  No interval change in medical history since that appointment. Please refer to that note for full details regarding GI history and clinical presentation.   Past Medical History:  Diagnosis Date   Anxiety    Depression    Hiatal hernia    Hypertension    Migraines    Mitral regurgitation    Ovarian cyst    left    Past Surgical History:  Procedure Laterality Date   BUNIONECTOMY Left    KNEE ARTHROCENTESIS Left     Prior to Admission medications   Medication Sig Start Date End Date Taking? Authorizing Provider  acetaminophen (TYLENOL) 500 MG tablet Take 1,000 mg by mouth every 6 (six) hours as needed for moderate pain or headache.   Yes [provider]  brexpiprazole (REXULTI) 1 MG TABS tablet Take 1 tablet by mouth daily.   Yes [provider]  chlorthalidone (HYGROTON) 25 MG tablet Take 25 mg by mouth every other day. 01/20/20  Yes [provider]  diltiazem (TIAZAC) 240 MG 24 hr capsule Take 240 mg by mouth at bedtime.   Yes [provider]  doxazosin (CARDURA) 2 MG tablet Take 1 tablet by mouth at bedtime. 10/19/19  Yes [provider]  irbesartan (AVAPRO) 300 MG tablet Take 1 tablet by mouth at bedtime.    Yes [provider]  L-Methylfolate 15 MG TABS Take 1 tablet by mouth daily.   Yes [provider]  LAMOTRIGINE PO Take 300 mg by mouth at bedtime.   Yes [provider]  temazepam (RESTORIL) 15 MG capsule Take 15 mg by mouth as needed for sleep.   Yes [provider]  traZODone (DESYREL) 150 MG tablet Take by mouth at bedtime.   Yes [provider]  vortioxetine HBr (TRINTELLIX) 20 MG TABS tablet Take 20 mg by mouth daily.   Yes [provider]  dicyclomine (BENTYL) 10 MG capsule Take 1 capsule (10 mg total) by mouth 3 (three) times daily as needed for spasms. Patient not taking: Reported on 01/04/2022 12/06/21   Sharyn Creamer, MD  eletriptan (RELPAX) 40 MG tablet Take 40 mg by mouth as needed for migraine or headache. May repeat in 2 hours if headache persists or recurs.    [provider]  Esketamine HCl (SPRAVATO, 84 MG DOSE, NA) Place 1 spray into the nose as needed.    [provider]  Galcanezumab-gnlm (EMGALITY) 120 MG/ML SOAJ Inject 120 mg into the skin every 30 (thirty) days. Inject into skin as directed on pen.    [provider]  linaclotide (LINZESS) 145 MCG CAPS capsule Take 145 mcg by mouth as needed.    [provider]  OnabotulinumtoxinA (BOTOX IJ) Inject 1 Dose as directed every 30 (thirty) days.  [provider]  ondansetron (ZOFRAN-ODT) 4 MG disintegrating tablet Take 4 mg by mouth as needed for nausea or vomiting.    [provider]  pantoprazole (PROTONIX) 40 MG tablet Take 40 mg by mouth as needed.    [provider]    Current Outpatient Medications  Medication Sig Dispense Refill   acetaminophen (TYLENOL) 500 MG tablet Take 1,000 mg by mouth every 6 (six) hours as needed for moderate pain or headache.     brexpiprazole (REXULTI) 1 MG TABS tablet Take 1 tablet by mouth daily.     chlorthalidone (HYGROTON) 25 MG tablet Take 25 mg by mouth every other day.      diltiazem (TIAZAC) 240 MG 24 hr capsule Take 240 mg by mouth at bedtime.     doxazosin (CARDURA) 2 MG tablet Take 1 tablet by mouth at bedtime.     irbesartan (AVAPRO) 300 MG tablet Take 1 tablet by mouth at bedtime.     L-Methylfolate 15 MG TABS Take 1 tablet by mouth daily.     LAMOTRIGINE PO Take 300 mg by mouth at bedtime.     temazepam (RESTORIL) 15 MG capsule Take 15 mg by mouth as needed for sleep.     traZODone (DESYREL) 150 MG tablet Take by mouth at bedtime.     vortioxetine HBr (TRINTELLIX) 20 MG TABS tablet Take 20 mg by mouth daily.     dicyclomine (BENTYL) 10 MG capsule Take 1 capsule (10 mg total) by mouth 3 (three) times daily as needed for spasms. (Patient not taking: Reported on 01/04/2022) 20 capsule 0   eletriptan (RELPAX) 40 MG tablet Take 40 mg by mouth as needed for migraine or headache. May repeat in 2 hours if headache persists or recurs.     Esketamine HCl (SPRAVATO, 84 MG DOSE, NA) Place 1 spray into the nose as needed.     Galcanezumab-gnlm (EMGALITY) 120 MG/ML SOAJ Inject 120 mg into the skin every 30 (thirty) days. Inject into skin as directed on pen.     linaclotide (LINZESS) 145 MCG CAPS capsule Take 145 mcg by mouth as needed.     OnabotulinumtoxinA (BOTOX IJ) Inject 1 Dose as directed every 30 (thirty) days.     ondansetron (ZOFRAN-ODT) 4 MG disintegrating tablet Take 4 mg by mouth as needed for nausea or vomiting.     pantoprazole (PROTONIX) 40 MG tablet Take 40 mg by mouth as needed.     Current Facility-Administered Medications  Medication Dose Route Frequency Provider Last Rate Last Admin   0.9 %  sodium chloride infusion  500 mL Intravenous Once Sharyn Creamer, MD        Allergies as of 01/04/2022 - Review Complete 01/04/2022  Allergen Reaction Noted   Cefdinir Itching 08/16/2020   Norvasc [amlodipine] Other (See Comments) 08/16/2020    Family History  Problem Relation Age of Onset   Heart disease Mother    Hypertension Mother    Asthma  Mother    Stroke Father    Hypertension Father    Parkinson's disease Brother    Uterine cancer Maternal Grandmother        mets to lung   Hypertension Maternal Grandmother    Lung cancer Maternal Grandmother    Liver cancer Maternal Grandfather    Stomach cancer Maternal Grandfather    Diabetes Maternal Grandfather    Pancreatic cancer Cousin    Colon cancer Cousin    Breast cancer Neg Hx     Social History  Socioeconomic History   Marital status: Single    Spouse name: Not on file   Number of children: 0   Years of education: Not on file   Highest education level: Not on file  Occupational History   Occupation: retired Pharmacist, hospital  Tobacco Use   Smoking status: Never   Smokeless tobacco: Never  Vaping Use   Vaping Use: Never used  Substance and Sexual Activity   Alcohol use: No   Drug use: No   Sexual activity: Not on file  Other Topics Concern   Not on file  Social History Narrative   Not on file   Social Determinants of Health   Financial Resource Strain: Not on file  Food Insecurity: Not on file  Transportation Needs: Not on file  Physical Activity: Not on file  Stress: Not on file  Social Connections: Not on file  Intimate Partner Violence: Not on file    Physical Exam: Vital signs in last 24 hours: BP 135/72   Pulse 92   Temp 98.4 F (36.9 C) (Temporal)   Ht '5\' 4"'$  (1.626 m)   Wt 153 lb (69.4 kg)   LMP 10/04/2014   SpO2 96%   BMI 26.26 kg/m  GEN: NAD EYE: Sclerae anicteric ENT: MMM CV: Non-tachycardic Pulm: No increased WOB GI: Soft NEURO:  Alert & Oriented   [name], MD [facility]   01/04/2022 12:46 PM

## 2022-01-04 NOTE — Op Note (Signed)
[facility] Patient Name: Ellaina Schuler Procedure Date: 01/04/2022 1:22 PM MRN: [id number] Endoscopist: Sonny Masters "[name] ,  Age: 56 Referring MD:  Date of Birth: 1965/05/27 Gender: Female Account #: [id number] Procedure:                Colonoscopy Indications:              Screening for colorectal malignant neoplasm Medicines:                Monitored Anesthesia Care Procedure:                Pre-Anesthesia Assessment:                           - Prior to the procedure, a History and Physical                            was performed, and patient medications and                            allergies were reviewed. The patient's tolerance of                            previous anesthesia was also reviewed. The risks                            and benefits of the procedure and the sedation                            options and risks were discussed with the patient.                            All questions were answered, and informed consent                            was obtained. Prior Anticoagulants: The patient has                            taken no previous anticoagulant or antiplatelet                            agents. ASA Grade Assessment: II - A patient with                            mild systemic disease. After reviewing the risks                            and benefits, the patient was deemed in                            satisfactory condition to undergo the procedure.                           After obtaining informed consent, the colonoscope  was passed under direct vision. Throughout the                            procedure, the patient's blood pressure, pulse, and                            oxygen saturations were monitored continuously. The                            Olympus CF-HQ190L ([id number]) Colonoscope was                            introduced through the anus and advanced to the the                            terminal  ileum. The colonoscopy was performed                            without difficulty. The patient tolerated the                            procedure well. The quality of the bowel                            preparation was excellent. The terminal ileum,                            ileocecal valve, appendiceal orifice, and rectum                            were photographed. Scope In: 1:38:20 PM Scope Out: 1:55:22 PM Scope Withdrawal Time: 0 hours 13 minutes 48 seconds  Total Procedure Duration: 0 hours 17 minutes 2 seconds  Findings:                 The terminal ileum appeared normal.                           Two sessile polyps were found in the sigmoid colon                            and descending colon. The polyps were 3 to 4 mm in                            size. These polyps were removed with a cold biopsy                            forceps. Resection and retrieval were complete.                           Multiple small and large-mouthed diverticula were                            found in the sigmoid colon.  Non-bleeding internal hemorrhoids were found during                            retroflexion. Complications:            No immediate complications. Estimated Blood Loss:     Estimated blood loss was minimal. Impression:               - The examined portion of the ileum was normal.                           - Two 3 to 4 mm polyps in the sigmoid colon and in                            the descending colon, removed with a cold biopsy                            forceps. Resected and retrieved.                           - Diverticulosis in the sigmoid colon.                           - Non-bleeding internal hemorrhoids. Recommendation:           - Discharge patient to home (with escort).                           - Await pathology results.                           - Return to GI clinic in 6 weeks.                           - The findings and recommendations  were discussed                            with the patient. Dr Georgian Co "[name]" [name],  01/04/2022 2:02:35 PM

## 2022-01-04 NOTE — Progress Notes (Signed)
Report given to PACU, vss 

## 2022-01-05 ENCOUNTER — Telehealth: Payer: Self-pay

## 2022-01-05 NOTE — Telephone Encounter (Signed)
  Follow up Call-     01/04/2022   12:32 PM  Call back number  Post procedure Call Back phone  # [phone]  Permission to leave phone message Yes     Patient questions:  Do you have a fever, pain , or abdominal swelling? No. Pain Score  0 *  Have you tolerated food without any problems? Yes.    Have you been able to return to your normal activities? Yes.    Do you have any questions about your discharge instructions: Diet   No. Medications  No. Follow up visit  No.  Do you have questions or concerns about your Care? No.  Actions: * If pain score is 4 or above: No action needed, pain <4.

## 2022-01-10 ENCOUNTER — Encounter: Payer: Self-pay | Admitting: Internal Medicine

## 2022-01-15 ENCOUNTER — Encounter: Payer: Self-pay | Admitting: Internal Medicine

## 2022-02-01 ENCOUNTER — Other Ambulatory Visit (HOSPITAL_COMMUNITY): Payer: Self-pay | Admitting: *Deleted

## 2022-02-02 ENCOUNTER — Ambulatory Visit (HOSPITAL_COMMUNITY)
Admission: RE | Admit: 2022-02-02 | Discharge: 2022-02-02 | Disposition: A | Discharge status: Home / Self Care | Payer: BC Managed Care – PPO | Source: Ambulatory Visit | Attending: Internal Medicine | Admitting: Internal Medicine

## 2022-02-02 DIAGNOSIS — M810 Age-related osteoporosis without current pathological fracture: Secondary | ICD-10-CM | POA: Diagnosis present

## 2022-02-02 MED ORDER — IBANDRONATE SODIUM 3 MG/3ML IV SOLN
3.0000 mg | Freq: Once | INTRAVENOUS | Status: AC
  Administered 2022-02-02: 3 mg via INTRAVENOUS
  Filled 2022-02-02 (×2): qty 3

## 2022-02-08 ENCOUNTER — Encounter: Payer: Self-pay | Admitting: Podiatry

## 2022-02-08 ENCOUNTER — Ambulatory Visit: Payer: BC Managed Care – PPO | Admitting: Podiatry

## 2022-02-08 DIAGNOSIS — M722 Plantar fascial fibromatosis: Secondary | ICD-10-CM

## 2022-02-08 MED ORDER — TRIAMCINOLONE ACETONIDE 10 MG/ML IJ SUSP
10.0000 mg | Freq: Once | INTRAMUSCULAR | Status: AC
  Administered 2022-02-08: 10 mg

## 2022-02-08 MED ORDER — MELOXICAM 15 MG PO TABS: 15.0000 mg | ORAL_TABLET | Freq: Every day | ORAL | 2 refills | Status: DC

## 2022-02-08 NOTE — Progress Notes (Signed)
Subjective:   Patient ID: Cynthia Roberts, female   DOB: 56 y.o.   MRN: [id number]   HPI Patient presents stating the heel pain came back around 7 weeks ago and its been really bothering her again and she admits he has not been using the night splint as we have instructed   ROS      Objective:  Physical Exam  Neurovascular status intact inflammation pain around the medial heel right with fluid buildup of the medial tendon with quite a bit of swelling surrounding the area     Assessment:  Acute plantar fasciitis right that is reoccurred to a significant range with patient who has not been doing some of the things we have recommended     Plan:  H&P reviewed condition and went ahead today recommended long-term orthotics and casting done today for customized orthotic devices sterile prep injected the medial fascial band 3 mg Kenalog 5 mg Xylocaine placed on Mobic 15 mg daily and instructed on night splint usage.  Reappoint when orthotics return earlier if necessary

## 2022-03-02 ENCOUNTER — Encounter: Payer: Self-pay | Admitting: Internal Medicine

## 2022-03-02 ENCOUNTER — Ambulatory Visit (INDEPENDENT_AMBULATORY_CARE_PROVIDER_SITE_OTHER): Payer: BC Managed Care – PPO | Admitting: Internal Medicine

## 2022-03-02 ENCOUNTER — Other Ambulatory Visit (INDEPENDENT_AMBULATORY_CARE_PROVIDER_SITE_OTHER): Payer: BC Managed Care – PPO

## 2022-03-02 VITALS — BP 124/70 | HR 71 | Ht 64.0 in | Wt 155.0 lb

## 2022-03-02 DIAGNOSIS — K449 Diaphragmatic hernia without obstruction or gangrene: Secondary | ICD-10-CM

## 2022-03-02 DIAGNOSIS — K760 Fatty (change of) liver, not elsewhere classified: Secondary | ICD-10-CM | POA: Diagnosis not present

## 2022-03-02 DIAGNOSIS — K5900 Constipation, unspecified: Secondary | ICD-10-CM | POA: Diagnosis not present

## 2022-03-02 DIAGNOSIS — R1013 Epigastric pain: Secondary | ICD-10-CM | POA: Diagnosis not present

## 2022-03-02 LAB — IBC + FERRITIN
Ferritin: 73.7 ng/mL (ref 10.0–291.0)
Iron: 132 ug/dL (ref 42–145)
Saturation Ratios: 32 % (ref 20.0–50.0)
TIBC: 413 ug/dL (ref 250.0–450.0)
Transferrin: 295 mg/dL (ref 212.0–360.0)

## 2022-03-02 NOTE — Patient Instructions (Signed)
Your provider has requested that you go to the basement level for lab work before leaving today. Press "B" on the elevator. The lab is located at the first door on the left as you exit the elevator.  Due to recent changes in healthcare laws, you may see the results of your imaging and laboratory studies on MyChart before your provider has had a chance to review them.  We understand that in some cases there may be results that are confusing or concerning to you. Not all laboratory results come back in the same time frame and the provider may be waiting for multiple results in order to interpret others.  Please give [place] 48 hours in order for your provider to thoroughly review all the results before contacting the office for clarification of your results.   _______________________________________________________  If you are age 34 or older, your body mass index should be between 23-30. Your Body mass index is 26.61 kg/m. If this is out of the aforementioned range listed, please consider follow up with your Primary Care Provider.  If you are age 43 or younger, your body mass index should be between 19-25. Your Body mass index is 26.61 kg/m. If this is out of the aformentioned range listed, please consider follow up with your Primary Care Provider.   ________________________________________________________  The [facility] providers would like to encourage you to use [facility] to communicate with providers for non-urgent requests or questions.  Due to long hold times on the telephone, sending your provider a message by [facility] may be a faster and more efficient way to get a response.  Please allow 48 business hours for a response.  Please remember that this is for non-urgent requests.  _______________________________________________________  Thank you for choosing me and [facility].  Dr. Dayna Barker

## 2022-03-02 NOTE — Progress Notes (Signed)
Chief Complaint: Constipation, ab pain  HPI : 56 year old female with history of migraines and depression presents for follow up of constipation and ab pain  Interval History: Her ab pain has resolved. She is currently taking PPI BID and has responded well. She is having occasional heartburn. She is still constipated but is doing better. She is taking Miralax and fiber. She only takes the Miralax when she has had a BM for a few days, and hasn't taken Miralax at all this week. She is on average having one BM once every 2-3 days. Bentyl has not helped with the ab pain so she stopped this. Denies alcohol use   Current Outpatient Medications  Medication Sig Dispense Refill   acetaminophen (TYLENOL) 500 MG tablet Take 1,000 mg by mouth every 6 (six) hours as needed for moderate pain or headache.     brexpiprazole (REXULTI) 1 MG TABS tablet Take 1 tablet by mouth daily.     chlorthalidone (HYGROTON) 25 MG tablet Take 25 mg by mouth every other day.     diltiazem (TIAZAC) 240 MG 24 hr capsule Take 240 mg by mouth at bedtime.     doxazosin (CARDURA) 2 MG tablet Take 1 tablet by mouth at bedtime.     eletriptan (RELPAX) 40 MG tablet Take 40 mg by mouth as needed for migraine or headache. May repeat in 2 hours if headache persists or recurs.     Esketamine HCl (SPRAVATO, 84 MG DOSE, NA) Place 1 spray into the nose as needed.     Galcanezumab-gnlm (EMGALITY) 120 MG/ML SOAJ Inject 120 mg into the skin every 30 (thirty) days. Inject into skin as directed on pen.     irbesartan (AVAPRO) 300 MG tablet Take 1 tablet by mouth at bedtime.     L-Methylfolate 15 MG TABS Take 1 tablet by mouth daily.     LAMOTRIGINE PO Take 300 mg by mouth at bedtime.     OnabotulinumtoxinA (BOTOX IJ) Inject 1 Dose as directed every 30 (thirty) days.     ondansetron (ZOFRAN-ODT) 4 MG disintegrating tablet Take 4 mg by mouth as needed for nausea or vomiting.     pantoprazole (PROTONIX) 40 MG tablet TAKE 1 TABLET(40 MG) BY MOUTH  TWICE DAILY 180 tablet 0   temazepam (RESTORIL) 15 MG capsule Take 15 mg by mouth as needed for sleep.     traZODone (DESYREL) 150 MG tablet Take by mouth at bedtime.     vortioxetine HBr (TRINTELLIX) 20 MG TABS tablet Take 20 mg by mouth daily.     No current facility-administered medications for this visit.    Physical Exam: BP 124/70 (BP Location: Left Arm, Patient Position: Sitting, Cuff Size: Normal)   Pulse 71   Ht '5\' 4"'$  (1.626 m)   Wt 155 lb (70.3 kg)   LMP 10/04/2014   SpO2 98%   BMI 26.61 kg/m  Constitutional: Pleasant,well-developed, female in no acute distress. HEENT: Normocephalic and atraumatic. Conjunctivae are normal. No scleral icterus. Cardiovascular: Normal rate, regular rhythm.  Pulmonary/chest: Effort normal and breath sounds normal. No wheezing, rales or rhonchi. Abdominal: Soft, nondistended, nontender. Bowel sounds active throughout. There are no masses palpable. No hepatomegaly. Extremities: No edema Neurological: Alert and oriented to person place and time. Skin: Skin is warm and dry. No rashes noted. Psychiatric: Normal mood and affect. Behavior is normal.  Labs 11/2020: TSH nml.   Labs 09/2021: CBC nml. CMP with low potassium of 3. FOBT negative.  TTE 01/15/20: Echocardiogram 01/15/2020:  Normal LV systolic function with visual EF 60-65%. Left ventricle cavity  is normal in size. Normal global wall motion. Indeterminate diastolic  filling pattern, elevated LAP. No obvious regional wall motion  abnormalities.  Mild (Grade I) mitral regurgitation.  Mild tricuspid regurgitation. No evidence of pulmonary hypertension. RVSP  measures 33 mmHg.  Mild pulmonic regurgitation.  IVC is dilated with a respiratory response of <50%.   KUB 10/20/21: IMPRESSION: Negative abdominal radiographs.  No acute cardiopulmonary disease.  CT A/P w/contrast 10/23/21: IMPRESSION: 1. Small hiatal hernia. Bowel-gas pattern is nonobstructive. Mild diverticulosis of the colon  without diverticulitis. Moderate stool burden in the colon. 2. 4.8 x 3.7 x 3.1 cm simple appearing cystic structure at the left adnexa. Recommend follow-up pelvic US in 6-12 months. Reference: JACR 2020 Feb;17(2):248-254  EGD 01/04/22: - LA Grade A reflux esophagitis with no bleeding. - Benign-appearing esophageal stenosis. Biopsied. - Medium-sized hiatal hernia. - Erythematous mucosa in the antrum. Biopsied. - Normal examined duodenum. Path: 1. Surgical [P], gastric bx's - ANTRAL AND OXYNTIC MUCOSA WITH NO SIGNIFICANT PATHOLOGIC CHANGES. - NO HELICOBACTER PYLORI IDENTIFIED. 2. Surgical [P], esophageal stricture - GASTROESOPHAGEAL MUCOSA WITH SLIGHT INFLAMMATION CONSISTENT WITH REFLUX.  Colonoscopy 01/04/22: - The examined portion of the ileum was normal. - Two 3 to 4 mm polyps in the sigmoid colon and in the descending colon, removed with a cold biopsy forceps. Resected and retrieved. - Diverticulosis in the sigmoid colon. - Non-bleeding internal hemorrhoids. 3. Surgical [P], colon, descending polyp x1; sigmoid polyp x1, polyp (2) - HYPERPLASTIC POLYP (2).  ASSESSMENT AND PLAN: Constipation Epigastric ab pain Hiatal hernia Fatty liver disease Patient's epigastric ab pain has responded well to GERD and constipation therapies. Will attempt to reduce her PPI therapy to QD to see if she maintains good effect.  On her recent ultrasound, she was found to have fatty liver so will do a basic fatty liver work up. - Check hepatitis A antibody, hepatitis B surface antibody, hepatitis B surface antigen, hepatitis C antibody, ferritin/IBC, ANA, IgG, ASMA - Daily fiber supplement - Cont Miralax PRN - Reduce pantoprazole 40 mg from BID to QD - Stop Bentyl - RTC 6 months  [name], MD  I spent 35 minutes of time, including in depth chart review, independent review of results as outlined above, communicating results with the patient directly, face-to-face time with the patient,  coordinating care, and ordering studies and medications as appropriate, and documentation.

## 2022-03-06 ENCOUNTER — Ambulatory Visit: Payer: BC Managed Care – PPO | Admitting: Cardiology

## 2022-03-06 ENCOUNTER — Encounter: Payer: Self-pay | Admitting: Cardiology

## 2022-03-06 VITALS — BP 135/80 | HR 79 | Resp 16 | Ht 64.0 in | Wt 155.8 lb

## 2022-03-06 DIAGNOSIS — I340 Nonrheumatic mitral (valve) insufficiency: Secondary | ICD-10-CM

## 2022-03-06 DIAGNOSIS — I10 Essential (primary) hypertension: Secondary | ICD-10-CM

## 2022-03-06 LAB — HEPATITIS B SURFACE ANTIBODY,QUALITATIVE: Hep B S Ab: NONREACTIVE

## 2022-03-06 LAB — HEPATITIS A ANTIBODY, TOTAL: Hepatitis A AB,Total: NONREACTIVE

## 2022-03-06 LAB — IGG: IgG (Immunoglobin G), Serum: 732 mg/dL (ref 600–1640)

## 2022-03-06 LAB — HEPATITIS C ANTIBODY: Hepatitis C Ab: NONREACTIVE

## 2022-03-06 LAB — ANA: Anti Nuclear Antibody (ANA): NEGATIVE

## 2022-03-06 LAB — ANTI-SMOOTH MUSCLE ANTIBODY, IGG: Actin (Smooth Muscle) Antibody (IGG): <20 U (ref <20)

## 2022-03-06 LAB — HEPATITIS B SURFACE ANTIGEN: Hepatitis B Surface Ag: NONREACTIVE

## 2022-03-06 NOTE — Progress Notes (Signed)
Date:  03/06/2022   ID:  [name], DOB 1965/10/16, MRN [id number]  PCP:  Crist Infante, MD  Cardiologist:  Rex Kras, DO, [facility] (established care 01/09/2020)  Date: 03/06/22 Last Office Visit: 03/03/2021  Chief Complaint  Patient presents with   Mitral Regurgitation   Follow-up    1 year    HPI  Cynthia Roberts is a 56 y.o. female whose past medical history and cardiovascular risk factors include: hypertension, depression, nonrheumatic mitral regurgitation.   Was referred to the practice in October 2021 for evaluation of a cardiac murmur.  She was noted to have mitral regurgitation on physical examination as well as 2D echo which improved with better blood pressure management.  During prior office visits due to recurrent precordial discomfort, multiple office visits/ER visits she did undergo coronary CTA which was overall unremarkable.  She presents today for 1 year follow-up visit.  She denies anginal discomfort or heart failure symptoms.  No hospitalizations or urgent care visits for cardiovascular reasons since last office visit.  Home blood pressures are within acceptable limits.  And overall functional capacity remains stable.  She has retired since last office visit but plans to return back to work part-time.  Patient is accompanied by her mother at today's office visit who also provides collateral history.  ALLERGIES: Allergies  Allergen Reactions   Cefdinir Itching    Other reaction(s): itching   Norvasc [Amlodipine] Other (See Comments)    Other reaction(s): headache headcaches     MEDICATION LIST PRIOR TO VISIT: Current Meds  Medication Sig   acetaminophen (TYLENOL) 500 MG tablet Take 1,000 mg by mouth every 6 (six) hours as needed for moderate pain or headache.   brexpiprazole (REXULTI) 1 MG TABS tablet Take 1 tablet by mouth daily.   chlorthalidone (HYGROTON) 25 MG tablet Take 25 mg by mouth daily.   diltiazem (TIAZAC) 240 MG 24 hr capsule Take 240  mg by mouth at bedtime.   doxazosin (CARDURA) 2 MG tablet Take 1 tablet by mouth at bedtime.   Esketamine HCl (SPRAVATO, 84 MG DOSE, NA) Place 1 spray into the nose as needed.   Galcanezumab-gnlm (EMGALITY) 120 MG/ML SOAJ Inject 120 mg into the skin every 30 (thirty) days. Inject into skin as directed on pen.   ibandronate (BONIVA) 150 MG tablet Take 150 mg by mouth every 30 (thirty) days. Take in the morning with a full glass of water, on an empty stomach, and do not take anything else by mouth or lie down for the next 30 min.   irbesartan (AVAPRO) 300 MG tablet Take 1 tablet by mouth at bedtime.   L-Methylfolate 15 MG TABS Take 1 tablet by mouth daily.   LAMOTRIGINE PO Take 300 mg by mouth at bedtime.   OnabotulinumtoxinA (BOTOX IJ) Inject 1 Dose as directed every 30 (thirty) days.   ondansetron (ZOFRAN-ODT) 4 MG disintegrating tablet Take 4 mg by mouth as needed for nausea or vomiting.   pantoprazole (PROTONIX) 40 MG tablet TAKE 1 TABLET(40 MG) BY MOUTH TWICE DAILY   temazepam (RESTORIL) 15 MG capsule Take 15 mg by mouth as needed for sleep.   traZODone (DESYREL) 150 MG tablet Take by mouth at bedtime.   vortioxetine HBr (TRINTELLIX) 20 MG TABS tablet Take 20 mg by mouth daily.     PAST MEDICAL HISTORY: Past Medical History:  Diagnosis Date   Anxiety    Depression    Hiatal hernia    Hypertension    Migraines  Mitral regurgitation    Osteoporosis    Ovarian cyst    left    PAST SURGICAL HISTORY: Past Surgical History:  Procedure Laterality Date   BUNIONECTOMY Left    KNEE ARTHROCENTESIS Left     FAMILY HISTORY: The patient family history includes Asthma in her mother; Colon cancer in her cousin; Diabetes in her maternal grandfather; Heart attack in her paternal grandfather; Heart failure in her paternal grandmother; Hypertension in her father, maternal grandmother, and mother; Liver cancer in her maternal grandfather; Lung cancer in her maternal grandmother; Pancreatic  cancer in her cousin; Parkinson's disease in her brother and paternal grandmother; Stomach cancer in her maternal grandfather; Stroke in her father; Uterine cancer in her maternal grandmother.  SOCIAL HISTORY:  The patient  reports that she has never smoked. She has never used smokeless tobacco. She reports that she does not drink alcohol and does not use drugs.  REVIEW OF SYSTEMS: Review of Systems  Constitutional: Negative for chills and fever.  HENT:  Negative for hoarse voice and nosebleeds.   Eyes:  Negative for discharge, double vision and pain.  Cardiovascular:  Negative for chest pain, claudication, dyspnea on exertion, leg swelling, orthopnea, palpitations, paroxysmal nocturnal dyspnea and syncope.  Respiratory:  Negative for hemoptysis and shortness of breath.   Musculoskeletal:  Negative for muscle cramps and myalgias.  Gastrointestinal:  Negative for abdominal pain, constipation, diarrhea, hematemesis, hematochezia, melena, nausea and vomiting.  Neurological:  Negative for dizziness and light-headedness.    PHYSICAL EXAM:    03/06/2022    4:27 PM 03/06/2022    3:56 PM 03/02/2022   10:33 AM  Vitals with BMI  Height  _0  _1   Weight  155 lbs 13 oz 155 lbs  BMI  99.24 26.83  Systolic 419 622 297  Diastolic 80 78 70  Pulse 79 74 71   Physical Exam  Constitutional: No distress.  Age appropriate, hemodynamically stable.   Neck: No JVD present.  Cardiovascular: Normal rate, regular rhythm, S1 normal, S2 normal, intact distal pulses and normal pulses. Exam reveals no gallop, no S3 and no S4.  No murmur heard. Pulmonary/Chest: Effort normal and breath sounds normal. No stridor. She has no wheezes. She has no rales.  Abdominal: Soft. Bowel sounds are normal. She exhibits no distension. There is no abdominal tenderness.  Musculoskeletal:        General: No edema.     Cervical back: Neck supple.  Neurological: She is alert and oriented to person, place, and time. She has  intact cranial nerves (2-12).  Skin: Skin is warm and moist.   CARDIAC DATABASE: EKG: 03/06/2022: Normal sinus rhythm, 70 bpm, left axis, left anterior fascicular block, LVH per voltage criteria.  Echocardiogram: 01/15/2020: Normal LV systolic function with visual EF 60-65%. Left ventricle cavity is normal in size. Normal global wall motion. Indeterminate diastolic filling pattern, elevated LAP. No obvious regional wall motion abnormalities. Mild (Grade I) mitral regurgitation. Mild tricuspid regurgitation. No evidence of pulmonary hypertension. RVSP measures 33 mmHg. Mild pulmonic regurgitation. IVC is dilated with a respiratory response of <50%. Compared to prior study dated 12/19/2019: MR and TR have improved since prior study (moderate to now mild) no other significant change.    Stress Testing: Exercise Sestamibi stress test 01/14/2020: Exercise nuclear stress test was performed using Bruce protocol. Patient reached 10.1 METS, and 93% of age predicted maximum heart rate. Exercise capacity was normal. Chest pain not reported. Heart rate and hemodynamic response were normal. Stress EKG  revealed no ischemic changes. SPECT images show apical thinning, likely physiological. Stress LVEF 69%. Low risk study.   Coronary CTA 02/07/2021: 1. Total coronary calcium score of 0. 2. Normal coronary origin with right dominance. 3. CAD-RADS = 0 No evidence of CAD (0%). 4. No significant incidental noncardiac findings.  RECOMMENDATIONS: Consider non-atherosclerotic causes of chest pain.  Heart Catheterization: None  LABORATORY DATA: External Labs: Collected: 12/05/2019 Creatinine 0.9 mg/dL. eGFR: 66 mL/min per 1.73 m Lipid profile: Total cholesterol 166, triglycerides 98, HDL 61, LDL 85, non HDL 105 Hemoglobin A1c: 4.9 TSH: 1.49   External Labs: Collected: 03/08/2020 Creatinine 1 mg/dL. eGFR: 58 mL/min per 1.73 m Sodium 139, potassium 4.1, chloride 101, bicarb 30, AST 13, ALT 10,  alkaline phosphatase 102.  IMPRESSION:    ICD-10-CM   1. Nonrheumatic mitral valve regurgitation  I34.0 EKG 12-Lead    PCV ECHOCARDIOGRAM COMPLETE    2. Benign hypertension  I10        RECOMMENDATIONS: Cynthia Roberts is a 56 y.o. female whose past medical history and cardiac risk factors include: hypertension, depression, nonrheumatic mitral regurgitation.   Initially referred to the practice for evaluation and management of mitral regurgitation.  Severity of MR improved with better blood pressure management.  Over the last 1 year patient has remained asymptomatic from a cardiovascular standpoint.  She brings in a few blood pressure readings for review which are overall within acceptable limits on current medical therapy.  Denies anginal discomfort or heart failure symptoms.  No recent hospitalizations or urgent care visits for cardiac reasons since last office visit.  Recommend repeating an echocardiogram in 2024 as a 3-year follow-up study to reevaluate MR.  As long as the MR severity remains relatively stable I would like to see her back on as-needed basis or sooner if needed.  Both patient and her mother are agreeable with the plan of care.   FINAL MEDICATION LIST END OF ENCOUNTER: No orders of the defined types were placed in this encounter.     Current Outpatient Medications:    acetaminophen (TYLENOL) 500 MG tablet, Take 1,000 mg by mouth every 6 (six) hours as needed for moderate pain or headache., Disp: , Rfl:    brexpiprazole (REXULTI) 1 MG TABS tablet, Take 1 tablet by mouth daily., Disp: , Rfl:    chlorthalidone (HYGROTON) 25 MG tablet, Take 25 mg by mouth daily., Disp: , Rfl:    diltiazem (TIAZAC) 240 MG 24 hr capsule, Take 240 mg by mouth at bedtime., Disp: , Rfl:    doxazosin (CARDURA) 2 MG tablet, Take 1 tablet by mouth at bedtime., Disp: , Rfl:    Esketamine HCl (SPRAVATO, 84 MG DOSE, NA), Place 1 spray into the nose as needed., Disp: , Rfl:    Galcanezumab-gnlm  (EMGALITY) 120 MG/ML SOAJ, Inject 120 mg into the skin every 30 (thirty) days. Inject into skin as directed on pen., Disp: , Rfl:    ibandronate (BONIVA) 150 MG tablet, Take 150 mg by mouth every 30 (thirty) days. Take in the morning with a full glass of water, on an empty stomach, and do not take anything else by mouth or lie down for the next 30 min., Disp: , Rfl:    irbesartan (AVAPRO) 300 MG tablet, Take 1 tablet by mouth at bedtime., Disp: , Rfl:    L-Methylfolate 15 MG TABS, Take 1 tablet by mouth daily., Disp: , Rfl:    LAMOTRIGINE PO, Take 300 mg by mouth at bedtime., Disp: , Rfl:  OnabotulinumtoxinA (BOTOX IJ), Inject 1 Dose as directed every 30 (thirty) days., Disp: , Rfl:    ondansetron (ZOFRAN-ODT) 4 MG disintegrating tablet, Take 4 mg by mouth as needed for nausea or vomiting., Disp: , Rfl:    pantoprazole (PROTONIX) 40 MG tablet, TAKE 1 TABLET(40 MG) BY MOUTH TWICE DAILY, Disp: 180 tablet, Rfl: 0   temazepam (RESTORIL) 15 MG capsule, Take 15 mg by mouth as needed for sleep., Disp: , Rfl:    traZODone (DESYREL) 150 MG tablet, Take by mouth at bedtime., Disp: , Rfl:    vortioxetine HBr (TRINTELLIX) 20 MG TABS tablet, Take 20 mg by mouth daily., Disp: , Rfl:   Orders Placed This Encounter  Procedures   EKG 12-Lead   PCV ECHOCARDIOGRAM COMPLETE    There are no Patient Instructions on file for this visit.  --Continue cardiac medications as reconciled in final medication list. --Return if symptoms worsen or fail to improve. Or sooner if needed. --Continue follow-up with your primary care physician regarding the management of your other chronic comorbid conditions.  Patient's questions and concerns were addressed to her satisfaction. She voices understanding of the instructions provided during this encounter.   This note was created using a voice recognition software as a result there may be grammatical errors inadvertently enclosed that do not reflect the nature of this encounter.  Every attempt is made to correct such errors.  Rex Kras, [place], [facility]  Pager: [phone] Office: [phone]

## 2022-03-13 ENCOUNTER — Telehealth: Payer: Self-pay | Admitting: Podiatry

## 2022-03-13 ENCOUNTER — Other Ambulatory Visit: Payer: Self-pay | Admitting: Neurology

## 2022-03-13 DIAGNOSIS — Q283 Other malformations of cerebral vessels: Secondary | ICD-10-CM

## 2022-03-13 NOTE — Telephone Encounter (Signed)
Left message on vm to schedule appt to pick up orthotics   No balance

## 2022-03-23 ENCOUNTER — Ambulatory Visit
Admission: RE | Admit: 2022-03-23 | Discharge: 2022-03-23 | Disposition: A | Discharge status: Home / Self Care | Payer: BC Managed Care – PPO | Source: Ambulatory Visit | Attending: Neurology | Admitting: Neurology

## 2022-03-23 DIAGNOSIS — Q283 Other malformations of cerebral vessels: Secondary | ICD-10-CM

## 2022-03-23 MED ORDER — IOPAMIDOL (ISOVUE-370) INJECTION 76%
75.0000 mL | Freq: Once | INTRAVENOUS | Status: AC | PRN
  Administered 2022-03-23: 75 mL via INTRAVENOUS

## 2022-03-29 ENCOUNTER — Encounter: Payer: Self-pay | Admitting: Internal Medicine

## 2022-03-29 ENCOUNTER — Ambulatory Visit: Payer: BC Managed Care – PPO | Admitting: *Deleted

## 2022-03-29 DIAGNOSIS — M722 Plantar fascial fibromatosis: Secondary | ICD-10-CM

## 2022-03-29 NOTE — Progress Notes (Signed)
Patient presents today to pick up custom molded foot orthotics recommended by Dr. Paulla Dolly.   Orthotics were dispensed and fit was satisfactory. Reviewed instructions for break-in and wear. Written instructions given to patient.  Patient will follow up as needed.   [facility] - order # [id number]

## 2022-04-03 ENCOUNTER — Encounter (HOSPITAL_BASED_OUTPATIENT_CLINIC_OR_DEPARTMENT_OTHER): Payer: Self-pay | Admitting: *Deleted

## 2022-04-03 ENCOUNTER — Emergency Department (HOSPITAL_BASED_OUTPATIENT_CLINIC_OR_DEPARTMENT_OTHER): Payer: BC Managed Care – PPO

## 2022-04-03 ENCOUNTER — Other Ambulatory Visit: Payer: Self-pay

## 2022-04-03 ENCOUNTER — Emergency Department (HOSPITAL_BASED_OUTPATIENT_CLINIC_OR_DEPARTMENT_OTHER)
Admission: EM | Admit: 2022-04-03 | Discharge: 2022-04-03 | Disposition: A | Discharge status: Home / Self Care | Payer: BC Managed Care – PPO | Attending: Emergency Medicine | Admitting: Emergency Medicine

## 2022-04-03 DIAGNOSIS — Z20822 Contact with and (suspected) exposure to covid-19: Secondary | ICD-10-CM | POA: Insufficient documentation

## 2022-04-03 DIAGNOSIS — I10 Essential (primary) hypertension: Secondary | ICD-10-CM | POA: Diagnosis not present

## 2022-04-03 DIAGNOSIS — Z79899 Other long term (current) drug therapy: Secondary | ICD-10-CM | POA: Diagnosis not present

## 2022-04-03 DIAGNOSIS — R197 Diarrhea, unspecified: Secondary | ICD-10-CM | POA: Diagnosis present

## 2022-04-03 DIAGNOSIS — A084 Viral intestinal infection, unspecified: Secondary | ICD-10-CM

## 2022-04-03 LAB — COMPREHENSIVE METABOLIC PANEL
ALT: 26 U/L (ref 0–44)
AST: 32 U/L (ref 15–41)
Albumin: 4.9 g/dL (ref 3.5–5.0)
Alkaline Phosphatase: 67 U/L (ref 38–126)
Anion gap: 14 (ref 5–15)
BUN: 29 mg/dL — ABNORMAL HIGH (ref 6–20)
CO2: 29 mmol/L (ref 22–32)
Calcium: 9.8 mg/dL (ref 8.9–10.3)
Chloride: 93 mmol/L — ABNORMAL LOW (ref 98–111)
Creatinine, Ser: 0.9 mg/dL (ref 0.44–1.00)
GFR, Estimated: >60 mL/min (ref >60)
Glucose, Bld: 142 mg/dL — ABNORMAL HIGH (ref 70–99)
Potassium: 3.3 mmol/L — ABNORMAL LOW (ref 3.5–5.1)
Sodium: 136 mmol/L (ref 135–145)
Total Bilirubin: 2.1 mg/dL — ABNORMAL HIGH (ref 0.3–1.2)
Total Protein: 7.5 g/dL (ref 6.5–8.1)

## 2022-04-03 LAB — URINALYSIS, ROUTINE W REFLEX MICROSCOPIC
Bacteria, UA: NONE SEEN
Bilirubin Urine: NEGATIVE
Glucose, UA: NEGATIVE mg/dL
Hgb urine dipstick: NEGATIVE
Ketones, ur: NEGATIVE mg/dL
Nitrite: NEGATIVE
Protein, ur: NEGATIVE mg/dL
Specific Gravity, Urine: 1.012 (ref 1.005–1.030)
pH: 6 (ref 5.0–8.0)

## 2022-04-03 LAB — CBC
HCT: 38.8 % (ref 36.0–46.0)
Hemoglobin: 13.9 g/dL (ref 12.0–15.0)
MCH: 30 pg (ref 26.0–34.0)
MCHC: 35.8 g/dL (ref 30.0–36.0)
MCV: 83.6 fL (ref 80.0–100.0)
Platelets: 245 10*3/uL (ref 150–400)
RBC: 4.64 MIL/uL (ref 3.87–5.11)
RDW: 12.5 % (ref 11.5–15.5)
WBC: 5.8 10*3/uL (ref 4.0–10.5)
nRBC: 0 % (ref 0.0–0.2)

## 2022-04-03 LAB — RESP PANEL BY RT-PCR (RSV, FLU A&B, COVID)  RVPGX2
Influenza A by PCR: NEGATIVE
Influenza B by PCR: NEGATIVE
Resp Syncytial Virus by PCR: NEGATIVE
SARS Coronavirus 2 by RT PCR: NEGATIVE

## 2022-04-03 LAB — PREGNANCY, URINE: Preg Test, Ur: NEGATIVE

## 2022-04-03 LAB — LIPASE, BLOOD: Lipase: <10 U/L — ABNORMAL LOW (ref 11–51)

## 2022-04-03 MED ORDER — POTASSIUM CHLORIDE 20 MEQ PO PACK
20.0000 meq | PACK | Freq: Once | ORAL | Status: AC
  Administered 2022-04-03: 20 meq via ORAL
  Filled 2022-04-03: qty 1

## 2022-04-03 MED ORDER — ONDANSETRON 4 MG PO TBDP: 4.0000 mg | ORAL_TABLET | Freq: Three times a day (TID) | ORAL | 0 refills | Status: AC | PRN

## 2022-04-03 MED ORDER — SODIUM CHLORIDE 0.9 % IV BOLUS
1000.0000 mL | Freq: Once | INTRAVENOUS | Status: AC
  Administered 2022-04-03: 1000 mL via INTRAVENOUS

## 2022-04-03 MED ORDER — ONDANSETRON HCL 4 MG/2ML IJ SOLN
4.0000 mg | Freq: Once | INTRAMUSCULAR | Status: AC
  Administered 2022-04-03: 4 mg via INTRAVENOUS
  Filled 2022-04-03: qty 2

## 2022-04-03 MED ORDER — POTASSIUM CHLORIDE CRYS ER 20 MEQ PO TBCR
20.0000 meq | EXTENDED_RELEASE_TABLET | Freq: Once | ORAL | Status: DC
  Filled 2022-04-03: qty 1

## 2022-04-03 NOTE — ED Notes (Signed)
Patient verbalizes understanding of discharge instructions. Opportunity for questioning and answers were provided. Patient discharged from ED.  °

## 2022-04-03 NOTE — ED Triage Notes (Signed)
Pt states that she has been having "flu like symptoms" she reports feeling sick since Saturday with body aches, chills and nausea and vomiting and diarrhea. Pt reports that she has not been able to keep anything down since Saturday. Pt tearful in triage.

## 2022-04-03 NOTE — ED Provider Notes (Signed)
[facility] EMERGENCY DEPT Provider Note   CSN: [id number] Arrival date & time: 04/03/22  1007     History  Chief Complaint  Patient presents with   Illness    MIOSHA BEHE is a 57 y.o. female with PMH significant for HTN, depression, and migraines who is presenting with vomiting and diarrhea. Patients symptoms started 2 days ago. She reports at least 10 episodes of vomiting and at least 10 episodes of diarrhea since that time. Non-bloody. Some fever as well, Tmax 103 at home. Took a home COVID test which was negative. No abdominal pain, urinary symptoms, cough, congestion, chest pain, or dyspnea. Unable to tolerate PO at home. No known sick contacts. No recent travel or new medications.    Home Medications Prior to Admission medications   Medication Sig Start Date End Date Taking? Authorizing Provider  acetaminophen (TYLENOL) 500 MG tablet Take 1,000 mg by mouth every 6 (six) hours as needed for moderate pain or headache.    [provider]  brexpiprazole (REXULTI) 1 MG TABS tablet Take 1 tablet by mouth daily.    [provider]  chlorthalidone (HYGROTON) 25 MG tablet Take 25 mg by mouth daily. 01/20/20   [provider]  diltiazem (TIAZAC) 240 MG 24 hr capsule Take 240 mg by mouth at bedtime.    [provider]  doxazosin (CARDURA) 2 MG tablet Take 1 tablet by mouth at bedtime. 10/19/19   [provider]  Esketamine HCl (SPRAVATO, 84 MG DOSE, NA) Place 1 spray into the nose as needed.    [provider]  Galcanezumab-gnlm (EMGALITY) 120 MG/ML SOAJ Inject 120 mg into the skin every 30 (thirty) days. Inject into skin as directed on pen.    [provider]  ibandronate (BONIVA) 150 MG tablet Take 150 mg by mouth every 30 (thirty) days. Take in the morning with a full glass of water, on an empty stomach, and do not take anything else by mouth or lie down for the next 30 min.    [provider]   irbesartan (AVAPRO) 300 MG tablet Take 1 tablet by mouth at bedtime.    [provider]  L-Methylfolate 15 MG TABS Take 1 tablet by mouth daily.    [provider]  LAMOTRIGINE PO Take 300 mg by mouth at bedtime.    [provider]  OnabotulinumtoxinA (BOTOX IJ) Inject 1 Dose as directed every 30 (thirty) days.    [provider]  ondansetron (ZOFRAN-ODT) 4 MG disintegrating tablet Take 1 tablet (4 mg total) by mouth every 8 (eight) hours as needed for nausea or vomiting. 04/03/22   Alcus Dad, MD  pantoprazole (PROTONIX) 40 MG tablet TAKE 1 TABLET(40 MG) BY MOUTH TWICE DAILY 01/04/22 04/04/22  Sharyn Creamer, MD  temazepam (RESTORIL) 15 MG capsule Take 15 mg by mouth as needed for sleep.    [provider]  traZODone (DESYREL) 150 MG tablet Take by mouth at bedtime.    [provider]  vortioxetine HBr (TRINTELLIX) 20 MG TABS tablet Take 20 mg by mouth daily.    [provider]      Allergies    Cefdinir and Norvasc [amlodipine]    Review of Systems   Review of Systems  Constitutional:  Positive for chills and fever.  HENT:  Negative for rhinorrhea.   Respiratory:  Negative for cough and shortness of breath.   Cardiovascular:  Negative for chest pain.  Gastrointestinal:  Positive for diarrhea, nausea and  vomiting. Negative for abdominal pain.  Genitourinary:  Negative for dysuria.  Skin:  Negative for rash.    Physical Exam Updated Vital Signs BP 129/81   Pulse 79   Temp 98.3 F (36.8 C) (Oral)   Resp 17   Wt 70.3 kg   LMP 10/04/2014   SpO2 97%   BMI 26.61 kg/m  Physical Exam Constitutional:      General: She is not in acute distress.    Appearance: She is not toxic-appearing.  HENT:     Head: Normocephalic and atraumatic.     Mouth/Throat:     Mouth: Mucous membranes are dry.     Pharynx: Oropharynx is clear.  Eyes:     Conjunctiva/sclera: Conjunctivae normal.  Cardiovascular:     Rate and Rhythm:  Normal rate and regular rhythm.     Heart sounds: Normal heart sounds.  Pulmonary:     Effort: Pulmonary effort is normal.     Breath sounds: Normal breath sounds.  Abdominal:     General: There is no distension.     Palpations: Abdomen is soft.     Tenderness: There is no abdominal tenderness.  Musculoskeletal:     Cervical back: Neck supple.  Skin:    General: Skin is warm and dry.     Capillary Refill: Capillary refill takes 2 to 3 seconds.  Neurological:     General: No focal deficit present.     Mental Status: She is alert. Mental status is at baseline.     ED Results / Procedures / Treatments   Labs (all labs ordered are listed, but only abnormal results are displayed) Labs Reviewed  LIPASE, BLOOD - Abnormal; Notable for the following components:      Result Value   Lipase <10 (*)    All other components within normal limits  COMPREHENSIVE METABOLIC PANEL - Abnormal; Notable for the following components:   Potassium 3.3 (*)    Chloride 93 (*)    Glucose, Bld 142 (*)    BUN 29 (*)    Total Bilirubin 2.1 (*)    All other components within normal limits  URINALYSIS, ROUTINE W REFLEX MICROSCOPIC - Abnormal; Notable for the following components:   Leukocytes,Ua TRACE (*)    All other components within normal limits  RESP PANEL BY RT-PCR (RSV, FLU A&B, COVID)  RVPGX2  CBC  PREGNANCY, URINE    EKG None  Radiology US Abdomen Limited RUQ (LIVER/GB)  Result Date: 04/03/2022 CLINICAL DATA:  Hyperbilirubinemia EXAM: ULTRASOUND ABDOMEN LIMITED RIGHT UPPER QUADRANT COMPARISON:  Ultrasound 12/09/2021. CT 10/23/2021. Older exams as well. FINDINGS: Gallbladder: No gallstones or wall thickening visualized. No sonographic Murphy sign noted by sonographer. Gallbladder is folded. Common bile duct: Diameter: 3 mm Liver: No focal lesion identified. Within normal limits in parenchymal echogenicity. Portal vein is patent on color Doppler imaging with normal direction of blood flow  towards the liver. Other: No ascites seen. IMPRESSION: No gallstones or ductal dilatation. Electronically Signed   By: Jill Side M.D.   On: 04/03/2022 12:50    Procedures Procedures   Medications Ordered in ED Medications  sodium chloride 0.9 % bolus 1,000 mL (0 mLs Intravenous Stopped 04/03/22 1302)  ondansetron (ZOFRAN) injection 4 mg (4 mg Intravenous Given 04/03/22 1159)  potassium chloride (KLOR-CON) packet 20 mEq (20 mEq Oral Given 04/03/22 1207)    ED Course/ Medical Decision Making/ A&P  Medical Decision Making Amount and/or Complexity of Data Reviewed Labs: ordered. Radiology: ordered.  Risk Prescription drug management.   This is a 57 year old female with PMH significant for HTN, depression, and migraines presenting with 2 days of nausea, vomiting, and diarrhea.  Reassuringly she has no abdominal pain and physical exam is unremarkable. VSS. Suspect viral gastroenteritis with associated mild dehydration.  Will obtain basic labs including CBC, CMP, UA and treat with Zofran and fluid bolus. Also considered intra-abdominal pathology such as colitis, diverticulitis, appendicitis however these are unlikely given benign abdominal exam therefore will hold off on any abdominal imaging at this time.  Lab work consistent with mild dehydration as evidence by hypokalemia, hypochloremia, slightly elevated BUN.  Bilirubin elevated to 2.1, may represent dehydration however will check RUQ ultrasound.  RUQ ultrasound with no acute findings.  Patient feeling much improved after Zofran and IV fluids.  Was able to tolerate sips of liquid here in the ED.  Stable for discharge home with Rx for prn Zofran with presumed viral gastroenteritis.  Return precautions reviewed, patient agreeable with this plan.   Final Clinical Impression(s) / ED Diagnoses Final diagnoses:  Viral gastroenteritis    Rx / DC Orders ED Discharge Orders          Ordered    ondansetron  (ZOFRAN-ODT) 4 MG disintegrating tablet  Every 8 hours PRN        04/03/22 1317            Alcus Dad, MD PGY-3, [place]    Alcus Dad, MD 04/03/22 1454    Lanagan, Alvin Critchley, DO 04/04/22 2440

## 2022-04-03 NOTE — ED Notes (Signed)
Pt passed Fluid Challenege.

## 2022-04-03 NOTE — ED Notes (Signed)
Pt aware of the need for a urine... Unable to currently... 

## 2022-04-03 NOTE — ED Notes (Signed)
Pt stated she is unable to provide urine sample at this time

## 2022-05-04 ENCOUNTER — Other Ambulatory Visit (HOSPITAL_COMMUNITY): Payer: Self-pay

## 2022-05-05 ENCOUNTER — Ambulatory Visit (HOSPITAL_COMMUNITY)
Admission: RE | Admit: 2022-05-05 | Discharge: 2022-05-05 | Disposition: A | Discharge status: Home / Self Care | Payer: BC Managed Care – PPO | Source: Ambulatory Visit | Attending: Internal Medicine | Admitting: Internal Medicine

## 2022-05-05 ENCOUNTER — Encounter (HOSPITAL_COMMUNITY): Payer: BC Managed Care – PPO

## 2022-05-05 DIAGNOSIS — M810 Age-related osteoporosis without current pathological fracture: Secondary | ICD-10-CM | POA: Insufficient documentation

## 2022-05-05 MED ORDER — IBANDRONATE SODIUM 3 MG/3ML IV SOLN
3.0000 mg | Freq: Once | INTRAVENOUS | Status: AC
  Administered 2022-05-05: 3 mg via INTRAVENOUS
  Filled 2022-05-05: qty 3

## 2022-08-02 ENCOUNTER — Other Ambulatory Visit (HOSPITAL_COMMUNITY): Payer: Self-pay | Admitting: *Deleted

## 2022-08-03 ENCOUNTER — Encounter (HOSPITAL_COMMUNITY): Payer: BC Managed Care – PPO

## 2022-08-04 ENCOUNTER — Inpatient Hospital Stay (HOSPITAL_COMMUNITY): Admission: RE | Admit: 2022-08-04 | Payer: BC Managed Care – PPO | Source: Ambulatory Visit

## 2022-09-04 ENCOUNTER — Inpatient Hospital Stay (HOSPITAL_COMMUNITY): Admission: RE | Admit: 2022-09-04 | Payer: BC Managed Care – PPO | Source: Ambulatory Visit

## 2022-09-21 ENCOUNTER — Other Ambulatory Visit (HOSPITAL_COMMUNITY): Payer: Self-pay

## 2022-09-25 ENCOUNTER — Ambulatory Visit (HOSPITAL_COMMUNITY)
Admission: RE | Admit: 2022-09-25 | Discharge: 2022-09-25 | Disposition: A | Discharge status: Home / Self Care | Payer: BC Managed Care – PPO | Source: Ambulatory Visit | Attending: Internal Medicine | Admitting: Internal Medicine

## 2022-09-25 DIAGNOSIS — M810 Age-related osteoporosis without current pathological fracture: Secondary | ICD-10-CM | POA: Insufficient documentation

## 2022-09-25 MED ORDER — IBANDRONATE SODIUM 3 MG/3ML IV SOLN
3.0000 mg | Freq: Once | INTRAVENOUS | Status: AC
  Administered 2022-09-25: 3 mg via INTRAVENOUS
  Filled 2022-09-25: qty 3

## 2022-10-13 ENCOUNTER — Encounter: Payer: Self-pay | Admitting: Internal Medicine

## 2022-10-13 ENCOUNTER — Ambulatory Visit (INDEPENDENT_AMBULATORY_CARE_PROVIDER_SITE_OTHER): Payer: BC Managed Care – PPO | Admitting: Internal Medicine

## 2022-10-13 ENCOUNTER — Other Ambulatory Visit: Payer: Self-pay | Admitting: Internal Medicine

## 2022-10-13 VITALS — BP 122/80 | HR 86 | Ht 64.0 in | Wt 157.0 lb

## 2022-10-13 DIAGNOSIS — K219 Gastro-esophageal reflux disease without esophagitis: Secondary | ICD-10-CM | POA: Diagnosis not present

## 2022-10-13 DIAGNOSIS — K449 Diaphragmatic hernia without obstruction or gangrene: Secondary | ICD-10-CM | POA: Diagnosis not present

## 2022-10-13 DIAGNOSIS — R1013 Epigastric pain: Secondary | ICD-10-CM

## 2022-10-13 MED ORDER — PANTOPRAZOLE SODIUM 40 MG PO TBEC: 40.0000 mg | DELAYED_RELEASE_TABLET | Freq: Two times a day (BID) | ORAL | 1 refills | Status: DC

## 2022-10-13 NOTE — Patient Instructions (Addendum)
We have sent the following medications to your pharmacy for you to pick up at your convenience: Pantoprazole  You are scheduled for a follow up visit on 12/15/22 at 3:40 pm  _______________________________________________________  If your blood pressure at your visit was 140/90 or greater, please contact your primary care physician to follow up on this.  _______________________________________________________  If you are age 56 or older, your body mass index should be between 23-30. Your Body mass index is 26.95 kg/m. If this is out of the aforementioned range listed, please consider follow up with your Primary Care Provider.  If you are age 65 or younger, your body mass index should be between 19-25. Your Body mass index is 26.95 kg/m. If this is out of the aformentioned range listed, please consider follow up with your Primary Care Provider.   ________________________________________________________  The [facility] providers would like to encourage you to use [facility] to communicate with providers for non-urgent requests or questions.  Due to long hold times on the telephone, sending your provider a message by [facility] may be a faster and more efficient way to get a response.  Please allow 48 business hours for a response.  Please remember that this is for non-urgent requests.  _______________________________________________________    Thank you for entrusting me with your care and for choosing [facility],  Dr. Eulah Pont

## 2022-10-13 NOTE — Progress Notes (Signed)
Chief Complaint: Constipation, ab pain  HPI : 57 year old female with history of migraines and depression presents for follow up of constipation and ab pain  Interval History: She has had a recurrence of her epigastric ab pain. She has chest burning and regurgitation on occasion. She uses pantoprazole PRN, usually once a week. She has one BM every 2-3 days. Endorses Miralax use 1-2 times per week. Pain does not get better after she has a BM. Bentyl did not work for her previously.  Current Outpatient Medications  Medication Sig Dispense Refill   acetaminophen (TYLENOL) 500 MG tablet Take 1,000 mg by mouth every 6 (six) hours as needed for moderate pain or headache.     brexpiprazole (REXULTI) 1 MG TABS tablet Take 1 tablet by mouth daily.     chlorthalidone (HYGROTON) 25 MG tablet Take 25 mg by mouth daily.     diltiazem (TIAZAC) 240 MG 24 hr capsule Take 240 mg by mouth at bedtime.     doxazosin (CARDURA) 2 MG tablet Take 1 tablet by mouth at bedtime.     Esketamine HCl (SPRAVATO, 84 MG DOSE, NA) Place 1 spray into the nose as needed.     ibandronate (BONIVA) 150 MG tablet Take 150 mg by mouth every 30 (thirty) days. Take in the morning with a full glass of water, on an empty stomach, and do not take anything else by mouth or lie down for the next 30 min.     irbesartan (AVAPRO) 300 MG tablet Take 1 tablet by mouth at bedtime.     L-Methylfolate 15 MG TABS Take 1 tablet by mouth daily.     LAMOTRIGINE PO Take 300 mg by mouth at bedtime.     OnabotulinumtoxinA (BOTOX IJ) Inject 1 Dose as directed every 30 (thirty) days.     ondansetron (ZOFRAN-ODT) 4 MG disintegrating tablet Take 1 tablet (4 mg total) by mouth every 8 (eight) hours as needed for nausea or vomiting. 20 tablet 0   pantoprazole (PROTONIX) 40 MG tablet Take 1 tablet (40 mg total) by mouth 2 (two) times daily. 60 tablet 1   rosuvastatin (CRESTOR) 20 MG tablet Take 20 mg by mouth daily.     temazepam (RESTORIL) 15 MG capsule Take  15 mg by mouth as needed for sleep. Pt taking 7.5 mg a day     traZODone (DESYREL) 150 MG tablet Take by mouth at bedtime.     vortioxetine HBr (TRINTELLIX) 20 MG TABS tablet Take 20 mg by mouth daily.     Galcanezumab-gnlm (EMGALITY) 120 MG/ML SOAJ Inject 120 mg into the skin every 30 (thirty) days. Inject into skin as directed on pen. (Patient not taking: Reported on 10/13/2022)     No current facility-administered medications for this visit.    Physical Exam: BP 122/80   Pulse 86   Ht 5\' 4"  (1.626 m)   Wt 157 lb (71.2 kg)   LMP 10/04/2014   BMI 26.95 kg/m  Constitutional: Pleasant,well-developed, female in no acute distress. HEENT: Normocephalic and atraumatic. Conjunctivae are normal. No scleral icterus. Cardiovascular: Normal rate, regular rhythm.  Pulmonary/chest: Effort normal and breath sounds normal. No wheezing, rales or rhonchi. Abdominal: Soft, nondistended, mildly tender in the epigastric area. Quiet bowel sounds. Extremities: No edema Neurological: Alert and oriented to person place and time. Skin: Skin is warm and dry. No rashes noted. Psychiatric: Normal mood and affect. Behavior is normal.  Labs 11/2020: TSH nml.   Labs 09/2021: CBC nml. CMP with  low potassium of 3. FOBT negative.  Labs 02/2022: Ferritin/IBC nml. IgG nml. ANA negative. ASMA negative. Hep B surface antibody negative. Hep B surface antigen negative. HCV antibody NR. Hep A antibody NR.   Labs 03/2022: CBC nml. CMP with low K of 3.3 and elevated BUN of 29, elevated total bilirubin of 2.1.   TTE 01/15/20: Echocardiogram 01/15/2020:  Normal LV systolic function with visual EF 60-65%. Left ventricle cavity  is normal in size. Normal global wall motion. Indeterminate diastolic  filling pattern, elevated LAP. No obvious regional wall motion  abnormalities.  Mild (Grade I) mitral regurgitation.  Mild tricuspid regurgitation. No evidence of pulmonary hypertension. RVSP  measures 33 mmHg.  Mild pulmonic  regurgitation.  IVC is dilated with a respiratory response of <50%.   KUB 10/20/21: IMPRESSION: Negative abdominal radiographs.  No acute cardiopulmonary disease.  CT A/P w/contrast 10/23/21: IMPRESSION: 1. Small hiatal hernia. Bowel-gas pattern is nonobstructive. Mild diverticulosis of the colon without diverticulitis. Moderate stool burden in the colon. 2. 4.8 x 3.7 x 3.1 cm simple appearing cystic structure at the left adnexa. Recommend follow-up pelvic US in 6-12 months. Reference: JACR 2020 Feb;17(2):248-254  RUQ U/S 04/03/22: IMPRESSION: No gallstones or ductal dilatation.  EGD 01/04/22: - LA Grade A reflux esophagitis with no bleeding. - Benign-appearing esophageal stenosis. Biopsied. - Medium-sized hiatal hernia. - Erythematous mucosa in the antrum. Biopsied. - Normal examined duodenum. Path: 1. Surgical [P], gastric bx's - ANTRAL AND OXYNTIC MUCOSA WITH NO SIGNIFICANT PATHOLOGIC CHANGES. - NO HELICOBACTER PYLORI IDENTIFIED. 2. Surgical [P], esophageal stricture - GASTROESOPHAGEAL MUCOSA WITH SLIGHT INFLAMMATION CONSISTENT WITH REFLUX.  Colonoscopy 01/04/22: - The examined portion of the ileum was normal. - Two 3 to 4 mm polyps in the sigmoid colon and in the descending colon, removed with a cold biopsy forceps. Resected and retrieved. - Diverticulosis in the sigmoid colon. - Non-bleeding internal hemorrhoids. 3. Surgical [P], colon, descending polyp x1; sigmoid polyp x1, polyp (2) - HYPERPLASTIC POLYP (2).  ASSESSMENT AND PLAN: Constipation Epigastric ab pain Hiatal hernia Fatty liver disease Patient has had a recurrence in her epigastric ab pain. Her pain previously responded well to PPI therapy so will have her start consistently taking PPI BID. She does have issues with constipation but will increase her PPI first to see if this helps with her symptoms. She has previously not responded to Bentyl therapy. LFTs have been elevated in the past, which has been  attributed to fatty liver. FIB-4 today is 1.41, which suggests F0-1 fibrosis but further investigation should be pursued. Thus will plan to recheck her LFTs in the future and consider elastography - Cont Miralax PRN - Increase pantoprazole 40 mg to BID - RTC 2 months. Likely recheck LFTs and CBC at next visit  Eulah Pont, MD  I spent 34 minutes of time, including in depth chart review, independent review of results as outlined above, communicating results with the patient directly, face-to-face time with the patient, coordinating care, and ordering studies and medications as appropriate, and documentation.

## 2022-10-31 ENCOUNTER — Emergency Department (HOSPITAL_COMMUNITY): Payer: BC Managed Care – PPO

## 2022-10-31 ENCOUNTER — Other Ambulatory Visit: Payer: Self-pay

## 2022-10-31 ENCOUNTER — Emergency Department (HOSPITAL_COMMUNITY)
Admission: EM | Admit: 2022-10-31 | Discharge: 2022-11-01 | Disposition: A | Discharge status: Home / Self Care | Payer: BC Managed Care – PPO | Attending: Emergency Medicine | Admitting: Emergency Medicine

## 2022-10-31 DIAGNOSIS — Z79899 Other long term (current) drug therapy: Secondary | ICD-10-CM | POA: Insufficient documentation

## 2022-10-31 DIAGNOSIS — R202 Paresthesia of skin: Secondary | ICD-10-CM | POA: Diagnosis present

## 2022-10-31 DIAGNOSIS — R0789 Other chest pain: Secondary | ICD-10-CM | POA: Insufficient documentation

## 2022-10-31 DIAGNOSIS — I10 Essential (primary) hypertension: Secondary | ICD-10-CM | POA: Diagnosis not present

## 2022-10-31 DIAGNOSIS — E876 Hypokalemia: Secondary | ICD-10-CM | POA: Diagnosis not present

## 2022-10-31 LAB — CBC WITH DIFFERENTIAL/PLATELET
Abs Immature Granulocytes: 0.06 10*3/uL (ref 0.00–0.07)
Basophils Absolute: 0 10*3/uL (ref 0.0–0.1)
Basophils Relative: 0 %
Eosinophils Absolute: 0.1 10*3/uL (ref 0.0–0.5)
Eosinophils Relative: 1 %
HCT: 35.1 % — ABNORMAL LOW (ref 36.0–46.0)
Hemoglobin: 12.1 g/dL (ref 12.0–15.0)
Immature Granulocytes: 1 %
Lymphocytes Relative: 11 %
Lymphs Abs: 1 10*3/uL (ref 0.7–4.0)
MCH: 29 pg (ref 26.0–34.0)
MCHC: 34.5 g/dL (ref 30.0–36.0)
MCV: 84.2 fL (ref 80.0–100.0)
Monocytes Absolute: 0.6 10*3/uL (ref 0.1–1.0)
Monocytes Relative: 6 %
Neutro Abs: 7.9 10*3/uL — ABNORMAL HIGH (ref 1.7–7.7)
Neutrophils Relative %: 81 %
Platelets: 218 10*3/uL (ref 150–400)
RBC: 4.17 MIL/uL (ref 3.87–5.11)
RDW: 11.9 % (ref 11.5–15.5)
WBC: 9.6 10*3/uL (ref 4.0–10.5)
nRBC: 0 % (ref 0.0–0.2)

## 2022-10-31 LAB — BASIC METABOLIC PANEL
Anion gap: 11 (ref 5–15)
BUN: 13 mg/dL (ref 6–20)
CO2: 27 mmol/L (ref 22–32)
Calcium: 9.3 mg/dL (ref 8.9–10.3)
Chloride: 98 mmol/L (ref 98–111)
Creatinine, Ser: 0.87 mg/dL (ref 0.44–1.00)
GFR, Estimated: >60 mL/min (ref >60)
Glucose, Bld: 136 mg/dL — ABNORMAL HIGH (ref 70–99)
Potassium: 2.7 mmol/L — CL (ref 3.5–5.1)
Sodium: 136 mmol/L (ref 135–145)

## 2022-10-31 LAB — TROPONIN I (HIGH SENSITIVITY)
Troponin I (High Sensitivity): 11 ng/L (ref <18)
Troponin I (High Sensitivity): 26 ng/L — ABNORMAL HIGH (ref <18)

## 2022-10-31 LAB — MAGNESIUM: Magnesium: 1.8 mg/dL (ref 1.7–2.4)

## 2022-10-31 MED ORDER — POTASSIUM CHLORIDE 20 MEQ PO PACK
60.0000 meq | PACK | Freq: Once | ORAL | Status: AC
  Administered 2022-10-31: 60 meq via ORAL
  Filled 2022-10-31: qty 3

## 2022-10-31 MED ORDER — POTASSIUM CHLORIDE CRYS ER 20 MEQ PO TBCR: 60.0000 meq | EXTENDED_RELEASE_TABLET | Freq: Once | ORAL | Status: DC

## 2022-10-31 MED ORDER — POTASSIUM CHLORIDE 20 MEQ PO PACK
40.0000 meq | PACK | Freq: Once | ORAL | Status: DC
  Filled 2022-10-31: qty 2

## 2022-10-31 MED ORDER — MAGNESIUM SULFATE 2 GM/50ML IV SOLN
2.0000 g | Freq: Once | INTRAVENOUS | Status: AC
  Administered 2022-11-01: 2 g via INTRAVENOUS
  Filled 2022-10-31: qty 50

## 2022-10-31 MED ORDER — SODIUM CHLORIDE 0.9 % IV BOLUS
1000.0000 mL | Freq: Once | INTRAVENOUS | Status: AC
  Administered 2022-10-31: 1000 mL via INTRAVENOUS

## 2022-10-31 NOTE — ED Provider Notes (Signed)
  Provider Note MRN:  [id number]  Arrival date & time: 11/01/22    ED Course and Medical Decision Making  Assumed care from [place] at shift change.  Abnormal sensation or tingling, found to be hypokalemic.  Anticipating discharge after laboratory assessment.  3 AM update: Second troponin returns minimally elevated going from 11 to 26.  Patient feeling nauseated and now endorsing an abnormal chest sensation.  Still favoring anxiety or GERD related discomfort.  Repeat EKG is unchanged, third troponin is flat at 26.  Patient feeling a lot better after Maalox.  Heart score is low, seems appropriate for discharge and close follow-up.  Procedures  Final Clinical Impressions(s) / ED Diagnoses     ICD-10-CM   1. Tingling  R20.2     2. Hypokalemia  E87.6     3. Chest pressure  R07.89       ED Discharge Orders     None         Discharge Instructions      We evaluated you for your abnormal sensation.  Your testing was reassuring including your neurologic exam.  Your cardiac enzymes were overall reassuring, minimally elevated.  Your chest x-ray was also normal.  We did find low potassium on your blood test.  This can sometimes cause a tingling sensation.  You were given potassium in the emergency department.  Please follow-up closely with your primary doctor in the next few days for recheck of your potassium level.  In the meantime you can eat however potassium foods such as potatoes or bananas.  Your blood pressure was slightly high in the emergency department, please keep a log and follow-up with your primary doctor so they can make adjustments to your medications as needed.  Recommend follow-up with your primary care doctor to discuss your symptoms and your results.  Please return to the emergency department if you have any new or worsening symptoms such as chest pain, difficulty breathing, lightheadedness or dizziness, fainting, facial droop, trouble swallowing, or any other  concerning symptoms.    Cynthia Roberts. Pilar Plate, MD [facility] Emergency Medicine [facility] [email or web address]    Sabas Sous, MD 11/01/22 [phone]

## 2022-10-31 NOTE — ED Triage Notes (Addendum)
Pt BIB [facility] from home. Pt had 2 episodes of bilateral upper extremity tingling/heaviness along with a "rushing" upward feeling followed by dizziness and heart pounding. Pt also has new tremor that started tonight. Pt denies CP and SOB.

## 2022-10-31 NOTE — Discharge Instructions (Addendum)
We evaluated you for your abnormal sensation.  Your testing was reassuring including your neurologic exam.  Your cardiac enzymes were overall reassuring, minimally elevated.  Your chest x-ray was also normal.  We did find low potassium on your blood test.  This can sometimes cause a tingling sensation.  You were given potassium in the emergency department.  Please follow-up closely with your primary doctor in the next few days for recheck of your potassium level.  In the meantime you can eat however potassium foods such as potatoes or bananas.  Your blood pressure was slightly high in the emergency department, please keep a log and follow-up with your primary doctor so they can make adjustments to your medications as needed.  Recommend follow-up with your primary care doctor to discuss your symptoms and your results.  Please return to the emergency department if you have any new or worsening symptoms such as chest pain, difficulty breathing, lightheadedness or dizziness, fainting, facial droop, trouble swallowing, or any other concerning symptoms.

## 2022-10-31 NOTE — ED Provider Notes (Signed)
[place] EMERGENCY DEPARTMENT AT [facility] Provider Note  CSN: [id number] Arrival date & time: 10/31/22 2009  Chief Complaint(s) Tingling  HPI Cynthia Roberts is a 57 y.o. female with history of anxiety, migraines, hypertension presenting to the emergency department with "whooshing "sensation.  Reports that this began earlier, radiated throughout the body from chest upward.  Also reports tingling to both hands.  Currently symptoms are improved.  No chest pain, shortness of breath, nausea, vomiting, headaches, fevers or chills, abdominal pain, dysuria, flank pain, any other new symptoms.   Past Medical History Past Medical History:  Diagnosis Date   Anxiety    Depression    Hiatal hernia    Hypertension    Migraines    Mitral regurgitation    Osteoporosis    Ovarian cyst    left   Patient Active Problem List   Diagnosis Date Noted   Precordial pain    Home Medication(s) Prior to Admission medications   Medication Sig Start Date End Date Taking? Authorizing Provider  acetaminophen (TYLENOL) 500 MG tablet Take 1,000 mg by mouth every 6 (six) hours as needed for moderate pain or headache.   Yes [provider]  brexpiprazole (REXULTI) 1 MG TABS tablet Take 1 tablet by mouth daily.   Yes [provider]  chlorthalidone (HYGROTON) 25 MG tablet Take 25 mg by mouth daily. 01/20/20  Yes [provider]  diltiazem (TIAZAC) 240 MG 24 hr capsule Take 240 mg by mouth at bedtime.   Yes [provider]  doxazosin (CARDURA) 2 MG tablet Take 1 tablet by mouth at bedtime. 10/19/19  Yes [provider]  Esketamine HCl (SPRAVATO, 84 MG DOSE, NA) Place 1 spray into the nose as needed (for depression).   Yes [provider]  Galcanezumab-gnlm (EMGALITY) 120 MG/ML SOAJ Inject 120 mg into the skin every 3 (three) months. Inject into skin as directed on pen.   Yes [provider]  ibandronate (BONIVA) 150 MG tablet Take 150 mg  by mouth every 30 (thirty) days. Take in the morning with a full glass of water, on an empty stomach, and do not take anything else by mouth or lie down for the next 30 min.   Yes [provider]  irbesartan (AVAPRO) 300 MG tablet Take 1 tablet by mouth at bedtime.   Yes [provider]  L-Methylfolate 15 MG TABS Take 1 tablet by mouth daily.   Yes [provider]  LAMOTRIGINE PO Take 300 mg by mouth at bedtime.   Yes [provider]  OnabotulinumtoxinA (BOTOX IJ) Inject 1 Dose as directed every 3 (three) months.   Yes [provider]  ondansetron (ZOFRAN-ODT) 4 MG disintegrating tablet Take 1 tablet (4 mg total) by mouth every 8 (eight) hours as needed for nausea or vomiting. 04/03/22  Yes Maury Dus, MD  pantoprazole (PROTONIX) 40 MG tablet TAKE 1 TABLET(40 MG) BY MOUTH TWICE DAILY 10/13/22  Yes Imogene Burn, MD  rosuvastatin (CRESTOR) 20 MG tablet Take 20 mg by mouth daily.   Yes [provider]  temazepam (RESTORIL) 15 MG capsule Take 15 mg by mouth as needed for sleep.   Yes [provider]  traZODone (DESYREL) 150 MG tablet Take 150 mg by mouth at bedtime.   Yes [provider]  vortioxetine HBr (TRINTELLIX) 20 MG TABS tablet Take 20 mg by mouth daily.   Yes [provider]  Past Surgical History Past Surgical History:  Procedure Laterality Date   BUNIONECTOMY Left    KNEE ARTHROCENTESIS Left    Family History Family History  Problem Relation Age of Onset   Hypertension Mother    Asthma Mother    Stroke Father    Hypertension Father    Parkinson's disease Brother    Uterine cancer Maternal Grandmother        mets to lung   Hypertension Maternal Grandmother    Lung cancer Maternal Grandmother    Liver cancer Maternal Grandfather    Stomach cancer Maternal Grandfather     Diabetes Maternal Grandfather    Parkinson's disease Paternal Grandmother    Heart failure Paternal Grandmother    Heart attack Paternal Grandfather    Pancreatic cancer Cousin    Colon cancer Cousin    Breast cancer Neg Hx     Social History Social History   Tobacco Use   Smoking status: Never   Smokeless tobacco: Never  Vaping Use   Vaping status: Never Used  Substance Use Topics   Alcohol use: No   Drug use: No   Allergies Cefdinir and Norvasc [amlodipine]  Review of Systems Review of Systems  All other systems reviewed and are negative.   Physical Exam Vital Signs  I have reviewed the triage vital signs Pulse 77   Temp 98.8 F (37.1 C) (Oral)   Resp 17   Ht 5\' 4"  (1.626 m)   Wt 70.3 kg   LMP 10/04/2014   SpO2 100%   BMI 26.61 kg/m  Physical Exam Vitals and nursing note reviewed.  Constitutional:      General: She is not in acute distress.    Appearance: She is well-developed.  HENT:     Head: Normocephalic and atraumatic.     Mouth/Throat:     Mouth: Mucous membranes are moist.  Eyes:     Pupils: Pupils are equal, round, and reactive to light.  Cardiovascular:     Rate and Rhythm: Normal rate and regular rhythm.     Pulses:          Radial pulses are 2+ on the right side and 2+ on the left side.     Heart sounds: No murmur heard. Pulmonary:     Effort: Pulmonary effort is normal. No respiratory distress.     Breath sounds: Normal breath sounds.  Abdominal:     General: Abdomen is flat.     Palpations: Abdomen is soft.     Tenderness: There is no abdominal tenderness.  Musculoskeletal:        General: No tenderness.     Right lower leg: No edema.     Left lower leg: No edema.  Skin:    General: Skin is warm and dry.  Neurological:     General: No focal deficit present.     Mental Status: She is alert. Mental status is at baseline.     Comments: Cranial nerves II through XII intact, strength 5 out of 5 in the bilateral upper and lower  extremities, no sensory deficit to light touch, no dysmetria on finger-nose-finger testing  Psychiatric:        Mood and Affect: Mood normal.        Behavior: Behavior normal.     ED Results and Treatments Labs (all labs ordered are listed, but only abnormal results are displayed) Labs Reviewed  CBC WITH DIFFERENTIAL/PLATELET - Abnormal; Notable for the following components:  Result Value   HCT 35.1 (*)    Neutro Abs 7.9 (*)    All other components within normal limits  BASIC METABOLIC PANEL - Abnormal; Notable for the following components:   Potassium 2.7 (*)    Glucose, Bld 136 (*)    All other components within normal limits  MAGNESIUM  TROPONIN I (HIGH SENSITIVITY)  TROPONIN I (HIGH SENSITIVITY)                                                                                                                          Radiology DG Chest Port 1 View  Result Date: 10/31/2022 CLINICAL DATA:  Weakness. EXAM: PORTABLE CHEST 1 VIEW COMPARISON:  October 20, 2021 FINDINGS: The heart size and mediastinal contours are within normal limits. Both lungs are clear. The visualized skeletal structures are unremarkable. IMPRESSION: No active disease. Electronically Signed   By: Aram Candela M.D.   On: 10/31/2022 20:50    Pertinent labs & imaging results that were available during my care of the patient were reviewed by me and considered in my medical decision making (see MDM for details).  Medications Ordered in ED Medications  magnesium sulfate IVPB 2 g 50 mL (has no administration in time range)  sodium chloride 0.9 % bolus 1,000 mL (0 mLs Intravenous Stopped 10/31/22 2223)  potassium chloride (KLOR-CON) packet 60 mEq (60 mEq Oral Given 10/31/22 2232)                                                                                                                                     Procedures Procedures  (including critical care time)  Medical Decision Making / ED  Course   MDM:  57 year old presenting to the emergency department with "whooshing "sensation.  Unclear what this sensation represents, physical examination including neurologic exam is reassuring.  Low concern for dangerous process.  Patient did report that sensation seems to be somewhere around the chest area so obtain testing including troponin, initial troponin negative.  Will obtain delta troponin.  EKG with no acute STEMI.  She further denies any specific chest pain or shortness of breath, nausea vomiting or other concerning symptoms.  She reports tingling in both hands, no evidence of acute stroke, symptoms have improved.  She has normal radial pulses and circulation.  Doubt pulmonary embolism, no tachycardia, hypoxia, shortness of breath. Suspect possible anxiety component of symptoms.  Labs were notable for hypokalemia, unclear if this would cause her tingling sensation/paresthesias, but will replete her potassium.  Also give supplemental magnesium.  Patient pending delta troponin. Signed out to Dr. Pilar Plate.       Additional history obtained: -Additional history obtained from ems -External records from outside source obtained and reviewed including: Chart review including previous notes, labs, imaging, consultation notes including previous PMD notes   Lab Tests: -I ordered, reviewed, and interpreted labs.   The pertinent results include:   Labs Reviewed  CBC WITH DIFFERENTIAL/PLATELET - Abnormal; Notable for the following components:      Result Value   HCT 35.1 (*)    Neutro Abs 7.9 (*)    All other components within normal limits  BASIC METABOLIC PANEL - Abnormal; Notable for the following components:   Potassium 2.7 (*)    Glucose, Bld 136 (*)    All other components within normal limits  MAGNESIUM  TROPONIN I (HIGH SENSITIVITY)  TROPONIN I (HIGH SENSITIVITY)    Notable for hypokalemia   EKG   EKG Interpretation Date/Time:  Tuesday October 31 2022 21:04:06  EDT Ventricular Rate:  71 PR Interval:  172 QRS Duration:  112 QT Interval:  415 QTC Calculation: 451 R Axis:   -38  Text Interpretation: Sinus arrhythmia Left ventricular hypertrophy Confirmed by Alvino Blood ([id number]) on 10/31/2022 9:15:04 PM         Imaging Studies ordered: I ordered imaging studies including CXR On my interpretation imaging demonstrates no acute process I independently visualized and interpreted imaging. I agree with the radiologist interpretation   Medicines ordered and prescription drug management: Meds ordered this encounter  Medications   sodium chloride 0.9 % bolus 1,000 mL   DISCONTD: potassium chloride SA (KLOR-CON M) CR tablet 60 mEq   potassium chloride (KLOR-CON) packet 60 mEq   magnesium sulfate IVPB 2 g 50 mL    -I have reviewed the patients home medicines and have made adjustments as needed    Cardiac Monitoring: The patient was maintained on a cardiac monitor.  I personally viewed and interpreted the cardiac monitored which showed an underlying rhythm of: NSR   Reevaluation: After the interventions noted above, I reevaluated the patient and found that their symptoms have improved  Co morbidities that complicate the patient evaluation  Past Medical History:  Diagnosis Date   Anxiety    Depression    Hiatal hernia    Hypertension    Migraines    Mitral regurgitation    Osteoporosis    Ovarian cyst    left      Dispostion: Disposition decision including need for hospitalization was considered, and patient disposition pending at time of sign out.     Final Clinical Impression(s) / ED Diagnoses Final diagnoses:  Tingling     This chart was dictated using voice recognition software.  Despite best efforts to proofread,  errors can occur which can change the documentation meaning.    Lonell Grandchild, MD 10/31/22 [phone]

## 2022-11-01 LAB — TROPONIN I (HIGH SENSITIVITY): Troponin I (High Sensitivity): 26 ng/L — ABNORMAL HIGH (ref <18)

## 2022-11-01 MED ORDER — ONDANSETRON 4 MG PO TBDP
4.0000 mg | ORAL_TABLET | Freq: Once | ORAL | Status: AC
  Administered 2022-11-01: 4 mg via ORAL
  Filled 2022-11-01: qty 1

## 2022-11-01 MED ORDER — ALUM & MAG HYDROXIDE-SIMETH 200-200-20 MG/5ML PO SUSP
30.0000 mL | Freq: Once | ORAL | Status: AC
  Administered 2022-11-01: 30 mL via ORAL
  Filled 2022-11-01: qty 30

## 2022-11-07 ENCOUNTER — Telehealth: Payer: Self-pay

## 2022-11-08 NOTE — Telephone Encounter (Signed)
Lori from [facility] called, she wanted to let us know that patient had an elevated BP of 191/100 and also had some chest pressure. She mentioned that her EKG was good and blood work was good and patient did not complain of SOB. After speaking to Dr. Odis Hollingshead, he will see patient on [place] and advised for patient to go to ED if symptom worsens before her scheduled appointment. I called Lori back, [facility] for her to contact patient, to let her know of appointment time and date.

## 2022-11-13 ENCOUNTER — Ambulatory Visit: Payer: BC Managed Care – PPO | Admitting: Cardiology

## 2022-11-13 ENCOUNTER — Encounter: Payer: Self-pay | Admitting: Cardiology

## 2022-11-13 VITALS — BP 128/69 | HR 80 | Resp 16 | Ht 64.0 in | Wt 153.0 lb

## 2022-11-13 DIAGNOSIS — R0789 Other chest pain: Secondary | ICD-10-CM

## 2022-11-13 DIAGNOSIS — I10 Essential (primary) hypertension: Secondary | ICD-10-CM

## 2022-11-13 DIAGNOSIS — E876 Hypokalemia: Secondary | ICD-10-CM

## 2022-11-13 DIAGNOSIS — I340 Nonrheumatic mitral (valve) insufficiency: Secondary | ICD-10-CM

## 2022-11-13 NOTE — Progress Notes (Signed)
Date:  11/13/2022   ID:  Cynthia Roberts, DOB 1966/01/01, MRN [id number]  PCP:  Rodrigo Ran, MD  Cardiologist:  Tessa Lerner, DO, [facility] (established care 01/09/2020)  Date: 11/13/22 Last Office Visit: 03/06/2022  Chief Complaint  Patient presents with   Nonrheumatic mitral valve regurgitation    HPI  Cynthia Roberts is a 57 y.o. female whose past medical history and cardiovascular risk factors include: hypertension, depression, nonrheumatic mitral regurgitation.   Was referred to the practice in October 2021 for evaluation of a cardiac murmur.  She was noted to have mitral regurgitation on physical examination as well as 2D echo which improved with better blood pressure management.  During prior office visits due to recurrent precordial discomfort, multiple office visits/ER visits she did undergo coronary CTA a total CAC was 0 and no epicardial coronary artery disease.  In August 2024 patient was in her usual state of health while watching TV she noted abrupt rise in her blood pressure on 191/100 and a pulse of 111 bpm.  She had gone to the ED via EMS and a chief complaint documented was tingling.  She also had concerns with regards to chest pressure/precordial pain.  High sensitive troponins during the ED visit were slightly above normal limits but essentially flat not suggestive of ACS.  Lab values consistent with hypokalemia which was repleted according to the patient.  An EKG during the recent ED visit noted sinus rhythm without underlying ischemia or injury pattern.  She was given some heartburn medications which helped her symptoms.  Chest pressure: Located on the top of her chest, anatomically around angle of Loui Occurring daily and has tapered off. Intermittent. At worst 4 out of 10. Not brought on by effort related activities, does not resolve with rest. She is currently on Protonix which has helped her symptoms unsure if it is a coincidence. In the ED she was also given a  GI cocktail which helped her symptoms.  Patient denied blood pressure log with approximately 7 readings.  Systolic blood pressures range between 130-170 mmHg and DBP ranges between 77-92 mmHg.  Continues to enjoy doing water aerobics 3 times a week for at least 60 minutes.  Patient is accompanied by her mother at today's office visit.  ALLERGIES: Allergies  Allergen Reactions   Cefdinir Itching    Other reaction(s): itching   Norvasc [Amlodipine] Other (See Comments)    Other reaction(s): headache headcaches     MEDICATION LIST PRIOR TO VISIT: Current Meds  Medication Sig   acetaminophen (TYLENOL) 500 MG tablet Take 1,000 mg by mouth every 6 (six) hours as needed for moderate pain or headache.   brexpiprazole (REXULTI) 1 MG TABS tablet Take 1 tablet by mouth daily.   chlorthalidone (HYGROTON) 25 MG tablet Take 25 mg by mouth daily.   diltiazem (TIAZAC) 240 MG 24 hr capsule Take 240 mg by mouth at bedtime.   doxazosin (CARDURA) 2 MG tablet Take 1 tablet by mouth at bedtime.   Esketamine HCl (SPRAVATO, 84 MG DOSE, NA) Place 1 spray into the nose as needed (for depression).   ibandronate (BONIVA) 150 MG tablet Take 150 mg by mouth every 30 (thirty) days. Take in the morning with a full glass of water, on an empty stomach, and do not take anything else by mouth or lie down for the next 30 min.   irbesartan (AVAPRO) 300 MG tablet Take 1 tablet by mouth at bedtime.   L-Methylfolate 15 MG TABS Take 1 tablet  by mouth daily.   LAMOTRIGINE PO Take 300 mg by mouth at bedtime.   OnabotulinumtoxinA (BOTOX IJ) Inject 1 Dose as directed every 3 (three) months.   ondansetron (ZOFRAN-ODT) 4 MG disintegrating tablet Take 1 tablet (4 mg total) by mouth every 8 (eight) hours as needed for nausea or vomiting.   pantoprazole (PROTONIX) 40 MG tablet TAKE 1 TABLET(40 MG) BY MOUTH TWICE DAILY   potassium chloride 20 MEQ/15ML (10%) SOLN Take 10 mEq by mouth daily.   prochlorperazine (COMPAZINE) 10 MG  tablet Take 10 mg by mouth every 6 (six) hours as needed for nausea.   rosuvastatin (CRESTOR) 20 MG tablet Take 20 mg by mouth daily.   temazepam (RESTORIL) 15 MG capsule Take 15 mg by mouth as needed for sleep.   traZODone (DESYREL) 150 MG tablet Take 150 mg by mouth at bedtime.   vortioxetine HBr (TRINTELLIX) 20 MG TABS tablet Take 20 mg by mouth daily.     PAST MEDICAL HISTORY: Past Medical History:  Diagnosis Date   Anxiety    Depression    Hiatal hernia    Hypertension    Migraines    Mitral regurgitation    Osteoporosis    Ovarian cyst    left    PAST SURGICAL HISTORY: Past Surgical History:  Procedure Laterality Date   BUNIONECTOMY Left    KNEE ARTHROCENTESIS Left     FAMILY HISTORY: The patient family history includes Asthma in her mother; Colon cancer in her cousin; Diabetes in her maternal grandfather; Heart attack in her paternal grandfather; Heart failure in her paternal grandmother; Hypertension in her father, maternal grandmother, and mother; Liver cancer in her maternal grandfather; Lung cancer in her maternal grandmother; Pancreatic cancer in her cousin; Parkinson's disease in her brother and paternal grandmother; Stomach cancer in her maternal grandfather; Stroke in her father; Uterine cancer in her maternal grandmother.  SOCIAL HISTORY:  The patient  reports that she has never smoked. She has never used smokeless tobacco. She reports that she does not drink alcohol and does not use drugs.  REVIEW OF SYSTEMS: Review of Systems  Cardiovascular:  Negative for chest pain, claudication, dyspnea on exertion, irregular heartbeat, leg swelling, near-syncope, orthopnea, palpitations, paroxysmal nocturnal dyspnea and syncope.  Respiratory:  Negative for shortness of breath.   Hematologic/Lymphatic: Negative for bleeding problem.  Musculoskeletal:  Negative for muscle cramps and myalgias.  Neurological:  Negative for dizziness and light-headedness.    PHYSICAL  EXAM:    11/13/2022    1:58 PM 11/01/2022    2:00 AM 11/01/2022   12:40 AM  Vitals with BMI  Height 5\' 4"     Weight 153 lbs    BMI 26.25    Systolic 128 157 213  Diastolic 69 85 94  Pulse 80 82 88   Physical Exam  Constitutional: No distress.  Age appropriate, hemodynamically stable.   Neck: No JVD present.  Cardiovascular: Normal rate, regular rhythm, S1 normal, S2 normal, intact distal pulses and normal pulses. Exam reveals no gallop, no S3 and no S4.  No murmur heard. Pulmonary/Chest: Effort normal and breath sounds normal. No stridor. She has no wheezes. She has no rales.  Abdominal: Soft. Bowel sounds are normal. She exhibits no distension. There is no abdominal tenderness.  Musculoskeletal:        General: No edema.     Cervical back: Neck supple.  Neurological: She is alert and oriented to person, place, and time. She has intact cranial nerves (2-12).  Skin: Skin  is warm and moist.   CARDIAC DATABASE: EKG: November 13, 2022: Sinus rhythm, 73 bpm, left axis, left anterior fascicular block, borderline LVH with voltage criteria, nonspecific T wave abnormality.  Echocardiogram: 01/15/2020: Normal LV systolic function with visual EF 60-65%. Left ventricle cavity is normal in size. Normal global wall motion. Indeterminate diastolic filling pattern, elevated LAP. No obvious regional wall motion abnormalities. Mild (Grade I) mitral regurgitation. Mild tricuspid regurgitation. No evidence of pulmonary hypertension. RVSP measures 33 mmHg. Mild pulmonic regurgitation. IVC is dilated with a respiratory response of <50%. Compared to prior study dated 12/19/2019: MR and TR have improved since prior study (moderate to now mild) no other significant change.    Stress Testing: Exercise Sestamibi stress test 01/14/2020: Exercise nuclear stress test was performed using Bruce protocol. Patient reached 10.1 METS, and 93% of age predicted maximum heart rate. Exercise capacity was normal. Chest  pain not reported. Heart rate and hemodynamic response were normal. Stress EKG revealed no ischemic changes. SPECT images show apical thinning, likely physiological. Stress LVEF 69%. Low risk study.   Coronary CTA 02/07/2021: 1. Total coronary calcium score of 0. 2. Normal coronary origin with right dominance. 3. CAD-RADS = 0 No evidence of CAD (0%). 4. No significant incidental noncardiac findings.  RECOMMENDATIONS: Consider non-atherosclerotic causes of chest pain.  Heart Catheterization: None  LABORATORY DATA: External Labs: Collected: 12/05/2019 Creatinine 0.9 mg/dL. eGFR: 66 mL/min per 1.73 m Lipid profile: Total cholesterol 166, triglycerides 98, HDL 61, LDL 85, non HDL 105 Hemoglobin A1c: 4.9 TSH: 1.49   External Labs: Collected: 03/08/2020 Creatinine 1 mg/dL. eGFR: 58 mL/min per 1.73 m Sodium 139, potassium 4.1, chloride 101, bicarb 30, AST 13, ALT 10, alkaline phosphatase 102.  IMPRESSION:    ICD-10-CM   1. Chest pressure  R07.89     2. Nonrheumatic mitral valve regurgitation  I34.0 EKG 12-Lead    3. Benign hypertension  I10     4. Hypokalemia  E87.6         RECOMMENDATIONS: ELSIA WOLFREY is a 57 y.o. female whose past medical history and cardiac risk factors include: hypertension, depression, nonrheumatic mitral regurgitation.   Chest pressure Symptoms are predominately noncardiac. Has undergone appropriate cardiovascular workup in the past including coronary CTA which noted a CAC of 0 and no evidence of epicardial coronary artery disease as of November 2022. Recent ER visit noted slightly elevated high sensitive troponins which are essentially flat not suggestive of ACS. EKG shows sinus rhythm without underlying injury pattern. Fact that symptoms have improved with the Protonix and also the GI cocktail during ED visit I suspect she may have worsening heartburn/gastritis.  She has had an EGD in October 2023 per patient.  I have asked her to follow-up  with her gastroenterology for further guidance. We discussed the role of sublingual nitroglycerin tablets.  She would like to hold off on the prescription. Her overall probability of developing significant CAD since her last evaluation in 2022 with a low.  However, the symptoms continue further workup could be considered.  I will see her back in close follow-up in 6 weeks or sooner if needed  Nonrheumatic mitral valve regurgitation Asymptomatic. Mild based on severity as of echo in 2021 Monitor for now  Benign hypertension Office blood pressures are very well-controlled controlled on current medical therapy. She brings in a log of her blood pressures which are approximately 7 readings which are not well-controlled. I have asked her to keep a better blood pressure log in the interim  to see if she truly has uncontrolled hypertension at home.  Because over titration of medical therapy will need to symptomatic hypotension and its side effects.  Hypokalemia Likely secondary to chlorthalidone. On supplementation  FINAL MEDICATION LIST END OF ENCOUNTER: No orders of the defined types were placed in this encounter.     Current Outpatient Medications:    acetaminophen (TYLENOL) 500 MG tablet, Take 1,000 mg by mouth every 6 (six) hours as needed for moderate pain or headache., Disp: , Rfl:    brexpiprazole (REXULTI) 1 MG TABS tablet, Take 1 tablet by mouth daily., Disp: , Rfl:    chlorthalidone (HYGROTON) 25 MG tablet, Take 25 mg by mouth daily., Disp: , Rfl:    diltiazem (TIAZAC) 240 MG 24 hr capsule, Take 240 mg by mouth at bedtime., Disp: , Rfl:    doxazosin (CARDURA) 2 MG tablet, Take 1 tablet by mouth at bedtime., Disp: , Rfl:    Esketamine HCl (SPRAVATO, 84 MG DOSE, NA), Place 1 spray into the nose as needed (for depression)., Disp: , Rfl:    ibandronate (BONIVA) 150 MG tablet, Take 150 mg by mouth every 30 (thirty) days. Take in the morning with a full glass of water, on an empty stomach,  and do not take anything else by mouth or lie down for the next 30 min., Disp: , Rfl:    irbesartan (AVAPRO) 300 MG tablet, Take 1 tablet by mouth at bedtime., Disp: , Rfl:    L-Methylfolate 15 MG TABS, Take 1 tablet by mouth daily., Disp: , Rfl:    LAMOTRIGINE PO, Take 300 mg by mouth at bedtime., Disp: , Rfl:    OnabotulinumtoxinA (BOTOX IJ), Inject 1 Dose as directed every 3 (three) months., Disp: , Rfl:    ondansetron (ZOFRAN-ODT) 4 MG disintegrating tablet, Take 1 tablet (4 mg total) by mouth every 8 (eight) hours as needed for nausea or vomiting., Disp: 20 tablet, Rfl: 0   pantoprazole (PROTONIX) 40 MG tablet, TAKE 1 TABLET(40 MG) BY MOUTH TWICE DAILY, Disp: 180 tablet, Rfl: 1   potassium chloride 20 MEQ/15ML (10%) SOLN, Take 10 mEq by mouth daily., Disp: , Rfl:    prochlorperazine (COMPAZINE) 10 MG tablet, Take 10 mg by mouth every 6 (six) hours as needed for nausea., Disp: , Rfl:    rosuvastatin (CRESTOR) 20 MG tablet, Take 20 mg by mouth daily., Disp: , Rfl:    temazepam (RESTORIL) 15 MG capsule, Take 15 mg by mouth as needed for sleep., Disp: , Rfl:    traZODone (DESYREL) 150 MG tablet, Take 150 mg by mouth at bedtime., Disp: , Rfl:    vortioxetine HBr (TRINTELLIX) 20 MG TABS tablet, Take 20 mg by mouth daily., Disp: , Rfl:   Orders Placed This Encounter  Procedures   EKG 12-Lead    There are no Patient Instructions on file for this visit.  --Continue cardiac medications as reconciled in final medication list. --Return in about 6 weeks (around 12/25/2022) for Reevaluation of, Chest pain. Or sooner if needed. --Continue follow-up with your primary care physician regarding the management of your other chronic comorbid conditions.  Patient's questions and concerns were addressed to her satisfaction. She voices understanding of the instructions provided during this encounter.   This note was created using a voice recognition software as a result there may be grammatical errors  inadvertently enclosed that do not reflect the nature of this encounter. Every attempt is made to correct such errors.  Allegra Cerniglia Odis Hollingshead, DO, [facility]  Pager:  [phone] Office: [phone]

## 2022-11-17 ENCOUNTER — Telehealth: Payer: Self-pay | Admitting: Internal Medicine

## 2022-11-17 NOTE — Telephone Encounter (Signed)
Inbound call from patient stating that she was seen at the ER for chest pressure and they wanted her to follow up with Dr. Leonides Schanz. Patient is scheduled for appointment with her on 9/20 at 3:40 and is requesting to speak with nurse to discuss if she can be seen before then to be checked out. Please advise.

## 2022-11-17 NOTE — Telephone Encounter (Signed)
Called patient back. No answer. Left a message in her voicemail offering an appointment 11/24/22 with Hyacinth Meeker, PA at 10:00 am for evaluation.  Did not cancel the 12/15/22 appointment with Dr Leonides Schanz. If she come to the appointment 11/24/22, it can be decided at that time if the 9/20 appointment is needed or to be canceled. Asked patient call back if she cannot make the appointment 11/24/22 at 10:00 am.

## 2022-11-20 NOTE — Telephone Encounter (Signed)
Patient has seen the appointment through My Chart and has not called to cancel.

## 2022-11-24 ENCOUNTER — Other Ambulatory Visit: Payer: BC Managed Care – PPO

## 2022-11-24 ENCOUNTER — Encounter: Payer: Self-pay | Admitting: Physician Assistant

## 2022-11-24 ENCOUNTER — Ambulatory Visit: Payer: BC Managed Care – PPO | Admitting: Physician Assistant

## 2022-11-24 VITALS — BP 122/78 | HR 72 | Ht 64.0 in | Wt 150.0 lb

## 2022-11-24 DIAGNOSIS — R7989 Other specified abnormal findings of blood chemistry: Secondary | ICD-10-CM

## 2022-11-24 DIAGNOSIS — R1013 Epigastric pain: Secondary | ICD-10-CM

## 2022-11-24 DIAGNOSIS — K760 Fatty (change of) liver, not elsewhere classified: Secondary | ICD-10-CM | POA: Diagnosis not present

## 2022-11-24 LAB — CBC WITH DIFFERENTIAL/PLATELET
Basophils Absolute: 0 10*3/uL (ref 0.0–0.1)
Basophils Relative: 0.3 % (ref 0.0–3.0)
Eosinophils Absolute: 0 10*3/uL (ref 0.0–0.7)
Eosinophils Relative: 0.8 % (ref 0.0–5.0)
HCT: 37.8 % (ref 36.0–46.0)
Hemoglobin: 13.3 g/dL (ref 12.0–15.0)
Lymphocytes Relative: 17.7 % (ref 12.0–46.0)
Lymphs Abs: 1.1 10*3/uL (ref 0.7–4.0)
MCHC: 35.3 g/dL (ref 30.0–36.0)
MCV: 83.4 fl (ref 78.0–100.0)
Monocytes Absolute: 0.4 10*3/uL (ref 0.1–1.0)
Monocytes Relative: 6.4 % (ref 3.0–12.0)
Neutro Abs: 4.8 10*3/uL (ref 1.4–7.7)
Neutrophils Relative %: 74.8 % (ref 43.0–77.0)
Platelets: 274 10*3/uL (ref 150.0–400.0)
RBC: 4.53 Mil/uL (ref 3.87–5.11)
RDW: 12.2 % (ref 11.5–15.5)
WBC: 6.4 10*3/uL (ref 4.0–10.5)

## 2022-11-24 LAB — HEPATIC FUNCTION PANEL
ALT: 10 U/L (ref 0–35)
AST: 12 U/L (ref 0–37)
Albumin: 4.5 g/dL (ref 3.5–5.2)
Alkaline Phosphatase: 79 U/L (ref 39–117)
Bilirubin, Direct: 0.2 mg/dL (ref 0.0–0.3)
Total Bilirubin: 0.9 mg/dL (ref 0.2–1.2)
Total Protein: 6.9 g/dL (ref 6.0–8.3)

## 2022-11-24 MED ORDER — AMBULATORY NON FORMULARY MEDICATION: 1 refills | Status: DC

## 2022-11-24 NOTE — Progress Notes (Signed)
Chief Complaint: Noncardiac chest pressure  HPI:    Cynthia Roberts is a 57 year old female with a past medical history as listed below, known to Dr. Leonides Schanz, who presents to clinic today for recent noncardiac chest pressure.    10/13/2022 patient seen in clinic by Dr. Leonides Schanz for epigastric pain and constipation.  At that time discussed fatty liver and elevated LFTs, recheck recommended in 2 months as well as a CBC.  Discussed issues with constipation and patient continued on MiraLAX as needed increase Pantoprazole to 40 twice daily.    10/31/2022 patient seen in the ED for a weird sensation in her chest/pressure.  Labs were fairly normal other than a slightly low potassium, cardiac workup negative.    11/13/2022 cardiology visit for chest pressure, at that time noted she been given GI cocktail in the ED which helped her symptoms.  At that time noted she had undergone appropriate cardiovascular workup in the past.  Everything was good.  Thought she may have worsening heartburn/gastritis.  Also discussed nitroglycerin but patient declined.    Today, the patient presents to clinic and tells me that she experienced a chest tightness, tells me this can happen off-and-on without real cause, apparently she was sitting and watching Seinfeld when this episode came on it felt like a tightness in her chest, this often can last for 3 to 4 days and will pulsate off-and-on but then she can go a couple of weeks with no symptoms at all.  Also describes epigastric discomfort which feels like she has been "punched", again this seems to come and go and there is no real trigger.  Apparently previously on medicines for anxiety and depression but has not been on these for a couple of years.  Denies any overt increase in stress or anxiety, no history of panic attacks though she tells me it feels like that almost.    Denies fever, chills or weight loss.  Previous GI workup: Labs 11/2020: TSH nml.    Labs 09/2021: CBC nml. CMP with  low potassium of 3. FOBT negative.   Labs 02/2022: Ferritin/IBC nml. IgG nml. ANA negative. ASMA negative. Hep B surface antibody negative. Hep B surface antigen negative. HCV antibody NR. Hep A antibody NR.    Labs 03/2022: CBC nml. CMP with low K of 3.3 and elevated BUN of 29, elevated total bilirubin of 2.1.    TTE 01/15/20: Echocardiogram 01/15/2020:  Normal LV systolic function with visual EF 60-65%. Left ventricle cavity  is normal in size. Normal global wall motion. Indeterminate diastolic  filling pattern, elevated LAP. No obvious regional wall motion  abnormalities.  Mild (Grade I) mitral regurgitation.  Mild tricuspid regurgitation. No evidence of pulmonary hypertension. RVSP  measures 33 mmHg.  Mild pulmonic regurgitation.  IVC is dilated with a respiratory response of <50%.    KUB 10/20/21: IMPRESSION: Negative abdominal radiographs.  No acute cardiopulmonary disease.   CT A/P w/contrast 10/23/21: IMPRESSION: 1. Small hiatal hernia. Bowel-gas pattern is nonobstructive. Mild diverticulosis of the colon without diverticulitis. Moderate stool burden in the colon. 2. 4.8 x 3.7 x 3.1 cm simple appearing cystic structure at the left adnexa. Recommend follow-up pelvic US in 6-12 months. Reference: JACR 2020 Feb;17(2):248-254   RUQ U/S 04/03/22: IMPRESSION: No gallstones or ductal dilatation.   EGD 01/04/22: - LA Grade A reflux esophagitis with no bleeding. - Benign-appearing esophageal stenosis. Biopsied. - Medium-sized hiatal hernia. - Erythematous mucosa in the antrum. Biopsied. - Normal examined duodenum. Path: 1. Surgical [P], gastric  bx's - ANTRAL AND OXYNTIC MUCOSA WITH NO SIGNIFICANT PATHOLOGIC CHANGES. - NO HELICOBACTER PYLORI IDENTIFIED. 2. Surgical [P], esophageal stricture - GASTROESOPHAGEAL MUCOSA WITH SLIGHT INFLAMMATION CONSISTENT WITH REFLUX.   Colonoscopy 01/04/22: - The examined portion of the ileum was normal. - Two 3 to 4 mm polyps in the sigmoid  colon and in the descending colon, removed with a cold biopsy forceps. Resected and retrieved. - Diverticulosis in the sigmoid colon. - Non-bleeding internal hemorrhoids. 3. Surgical [P], colon, descending polyp x1; sigmoid polyp x1, polyp (2) - HYPERPLASTIC POLYP (2).  Past Medical History:  Diagnosis Date   Anxiety    Depression    Hiatal hernia    Hypertension    Migraines    Mitral regurgitation    Osteoporosis    Ovarian cyst    left    Past Surgical History:  Procedure Laterality Date   BUNIONECTOMY Left    KNEE ARTHROCENTESIS Left     Current Outpatient Medications  Medication Sig Dispense Refill   acetaminophen (TYLENOL) 500 MG tablet Take 1,000 mg by mouth every 6 (six) hours as needed for moderate pain or headache.     brexpiprazole (REXULTI) 1 MG TABS tablet Take 1 tablet by mouth daily.     chlorthalidone (HYGROTON) 25 MG tablet Take 25 mg by mouth daily.     diltiazem (TIAZAC) 240 MG 24 hr capsule Take 240 mg by mouth at bedtime.     doxazosin (CARDURA) 2 MG tablet Take 1 tablet by mouth at bedtime.     Esketamine HCl (SPRAVATO, 84 MG DOSE, NA) Place 1 spray into the nose as needed (for depression).     ibandronate (BONIVA) 150 MG tablet Take 150 mg by mouth every 30 (thirty) days. Take in the morning with a full glass of water, on an empty stomach, and do not take anything else by mouth or lie down for the next 30 min.     irbesartan (AVAPRO) 300 MG tablet Take 1 tablet by mouth at bedtime.     L-Methylfolate 15 MG TABS Take 1 tablet by mouth daily.     LAMOTRIGINE PO Take 300 mg by mouth at bedtime.     OnabotulinumtoxinA (BOTOX IJ) Inject 1 Dose as directed every 3 (three) months.     ondansetron (ZOFRAN-ODT) 4 MG disintegrating tablet Take 1 tablet (4 mg total) by mouth every 8 (eight) hours as needed for nausea or vomiting. 20 tablet 0   pantoprazole (PROTONIX) 40 MG tablet TAKE 1 TABLET(40 MG) BY MOUTH TWICE DAILY 180 tablet 1   potassium chloride 20  MEQ/15ML (10%) SOLN Take 10 mEq by mouth daily.     prochlorperazine (COMPAZINE) 10 MG tablet Take 10 mg by mouth every 6 (six) hours as needed for nausea.     rosuvastatin (CRESTOR) 20 MG tablet Take 20 mg by mouth daily.     temazepam (RESTORIL) 15 MG capsule Take 15 mg by mouth as needed for sleep.     traZODone (DESYREL) 150 MG tablet Take 150 mg by mouth at bedtime.     vortioxetine HBr (TRINTELLIX) 20 MG TABS tablet Take 20 mg by mouth daily.     No current facility-administered medications for this visit.    Allergies as of 11/24/2022 - Review Complete 11/13/2022  Allergen Reaction Noted   Cefdinir Itching 08/16/2020   Norvasc [amlodipine] Other (See Comments) 08/16/2020    Family History  Problem Relation Age of Onset   Hypertension Mother    Asthma Mother  Stroke Father    Hypertension Father    Parkinson's disease Brother    Uterine cancer Maternal Grandmother        mets to lung   Hypertension Maternal Grandmother    Lung cancer Maternal Grandmother    Liver cancer Maternal Grandfather    Stomach cancer Maternal Grandfather    Diabetes Maternal Grandfather    Parkinson's disease Paternal Grandmother    Heart failure Paternal Grandmother    Heart attack Paternal Grandfather    Pancreatic cancer Cousin    Colon cancer Cousin    Breast cancer Neg Hx     Social History   Socioeconomic History   Marital status: Single    Spouse name: Not on file   Number of children: 0   Years of education: Not on file   Highest education level: Not on file  Occupational History   Occupation: retired Runner, broadcasting/film/video  Tobacco Use   Smoking status: Never   Smokeless tobacco: Never  Vaping Use   Vaping status: Never Used  Substance and Sexual Activity   Alcohol use: No   Drug use: No   Sexual activity: Not on file  Other Topics Concern   Not on file  Social History Narrative   Not on file   Social Determinants of Health   Financial Resource Strain: Not on file  Food  Insecurity: Not on file  Transportation Needs: Not on file  Physical Activity: Not on file  Stress: Not on file  Social Connections: Not on file  Intimate Partner Violence: Not on file    Review of Systems:    Constitutional: No weight loss, fever or chills Cardiovascular: See HPI Respiratory: No SOB  Gastrointestinal: See HPI and otherwise negative   Physical Exam:  Vital signs: BP 122/78   Pulse 72   Ht 5\' 4"  (1.626 m)   Wt 150 lb (68 kg)   LMP 10/04/2014   BMI 25.75 kg/m    Constitutional:   Pleasant Caucasian female appears to be in NAD, Well developed, Well nourished, alert and cooperative.  Respiratory: Respirations even and unlabored. Lungs clear to auscultation bilaterally.   No wheezes, crackles, or rhonchi.  Cardiovascular: Normal S1, S2. No MRG. Regular rate and rhythm. No peripheral edema, cyanosis or pallor.  Gastrointestinal:  Soft, nondistended, nontender. No rebound or guarding. Normal bowel sounds. No appreciable masses or hepatomegaly. Rectal:  Not performed.  Psychiatric: Demonstrates good judgement and reason without abnormal affect or behaviors.  RELEVANT LABS AND IMAGING: CBC    Component Value Date/Time   WBC 9.6 10/31/2022 2112   RBC 4.17 10/31/2022 2112   HGB 12.1 10/31/2022 2112   HCT 35.1 (L) 10/31/2022 2112   PLT 218 10/31/2022 2112   MCV 84.2 10/31/2022 2112   MCH 29.0 10/31/2022 2112   MCHC 34.5 10/31/2022 2112   RDW 11.9 10/31/2022 2112   LYMPHSABS 1.0 10/31/2022 2112   MONOABS 0.6 10/31/2022 2112   EOSABS 0.1 10/31/2022 2112   BASOSABS 0.0 10/31/2022 2112    CMP     Component Value Date/Time   NA 136 10/31/2022 2112   NA 145 (H) 01/31/2021 1511   K 2.7 (LL) 10/31/2022 2112   CL 98 10/31/2022 2112   CO2 27 10/31/2022 2112   GLUCOSE 136 (H) 10/31/2022 2112   BUN 13 10/31/2022 2112   BUN 18 01/31/2021 1511   CREATININE 0.87 10/31/2022 2112   CALCIUM 9.3 10/31/2022 2112   PROT 7.5 04/03/2022 1042   ALBUMIN 4.9 04/03/2022 1042  AST 32 04/03/2022 1042   ALT 26 04/03/2022 1042   ALKPHOS 67 04/03/2022 1042   BILITOT 2.1 (H) 04/03/2022 1042   GFRNONAA >60 10/31/2022 2112    Assessment: 1.  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  Epigastric pain: Chronic for the patient, some better with twice daily PPI, recent episode of chest discomfort which patient describes as different from epigastric pain, evaluated in the ER and full cardiac workup which were negative; GERD could be contributing/minus functional dyspepsia +/- anxiety  Plan: 1.  Discussed that it sounds like patient is describing anxiety/panic attack, certainly she has a history of these things then it could be contributing.  Could consider Amitriptyline in the future or sending her to PCP to restart something for this.  She verbalized understanding. 2.  For now continue Pantoprazole 40 mg twice daily as started by Dr. Leonides Schanz. 3.  Prescribed GI cocktail 5-10 mL every 4-6 hours as needed for discomfort.  Discussed that if she takes some of this at home and the pain lingers or spreads throughout her chest then she may need to return to the ER, though she has been fully worked up for cardiac origin recently and that is reassuring. 4.  Also discussed extensive GI workup in the past, I do not think we need to order any other emergent test, could possibly consider esophageal manometry in the future if necessary. 5.  Patient wished to keep her office visit with Dr. Leonides Schanz but wanted to move back a few weeks, it was moved to the beginning of October 6.  Went ahead and ordered CBC and LFTs today.  Cynthia Meeker, Cynthia Roberts [facility] 11/24/2022, 9:59 AM  Cc: Rodrigo Ran, MD

## 2022-11-24 NOTE — Patient Instructions (Addendum)
Your provider has requested that you go to the basement level for lab work before leaving today. Press "B" on the elevator. The lab is located at the first door on the left as you exit the elevator.  We have sent the following medications to your pharmacy for you to pick up at your convenience: GI cocktail - 5-10 ml every 4-6 hours as needed.   Continue Pantoprazole.   _______________________________________________________  If your blood pressure at your visit was 140/90 or greater, please contact your primary care physician to follow up on this.  _______________________________________________________  If you are age 37 or older, your body mass index should be between 23-30. Your Body mass index is 25.75 kg/m. If this is out of the aforementioned range listed, please consider follow up with your Primary Care Provider.  If you are age 32 or younger, your body mass index should be between 19-25. Your Body mass index is 25.75 kg/m. If this is out of the aformentioned range listed, please consider follow up with your Primary Care Provider.   ________________________________________________________  The [facility] providers would like to encourage you to use [facility] to communicate with providers for non-urgent requests or questions.  Due to long hold times on the telephone, sending your provider a message by [facility] may be a faster and more efficient way to get a response.  Please allow 48 business hours for a response.  Please remember that this is for non-urgent requests.  _______________________________________________________

## 2022-11-24 NOTE — Progress Notes (Signed)
I agree with the assessment and plan as outlined by Ms. Lemmon. 

## 2022-12-15 ENCOUNTER — Ambulatory Visit: Payer: BC Managed Care – PPO | Admitting: Internal Medicine

## 2022-12-20 IMAGING — DX DG CHEST 2V
2 series · 2 of 2 positions shown · non-contrast
Comparison: Chest CT 10/08/2020

CLINICAL DATA: Chest pain.  Dizziness.

EXAM:
CHEST - 2 VIEW

[chest pa]
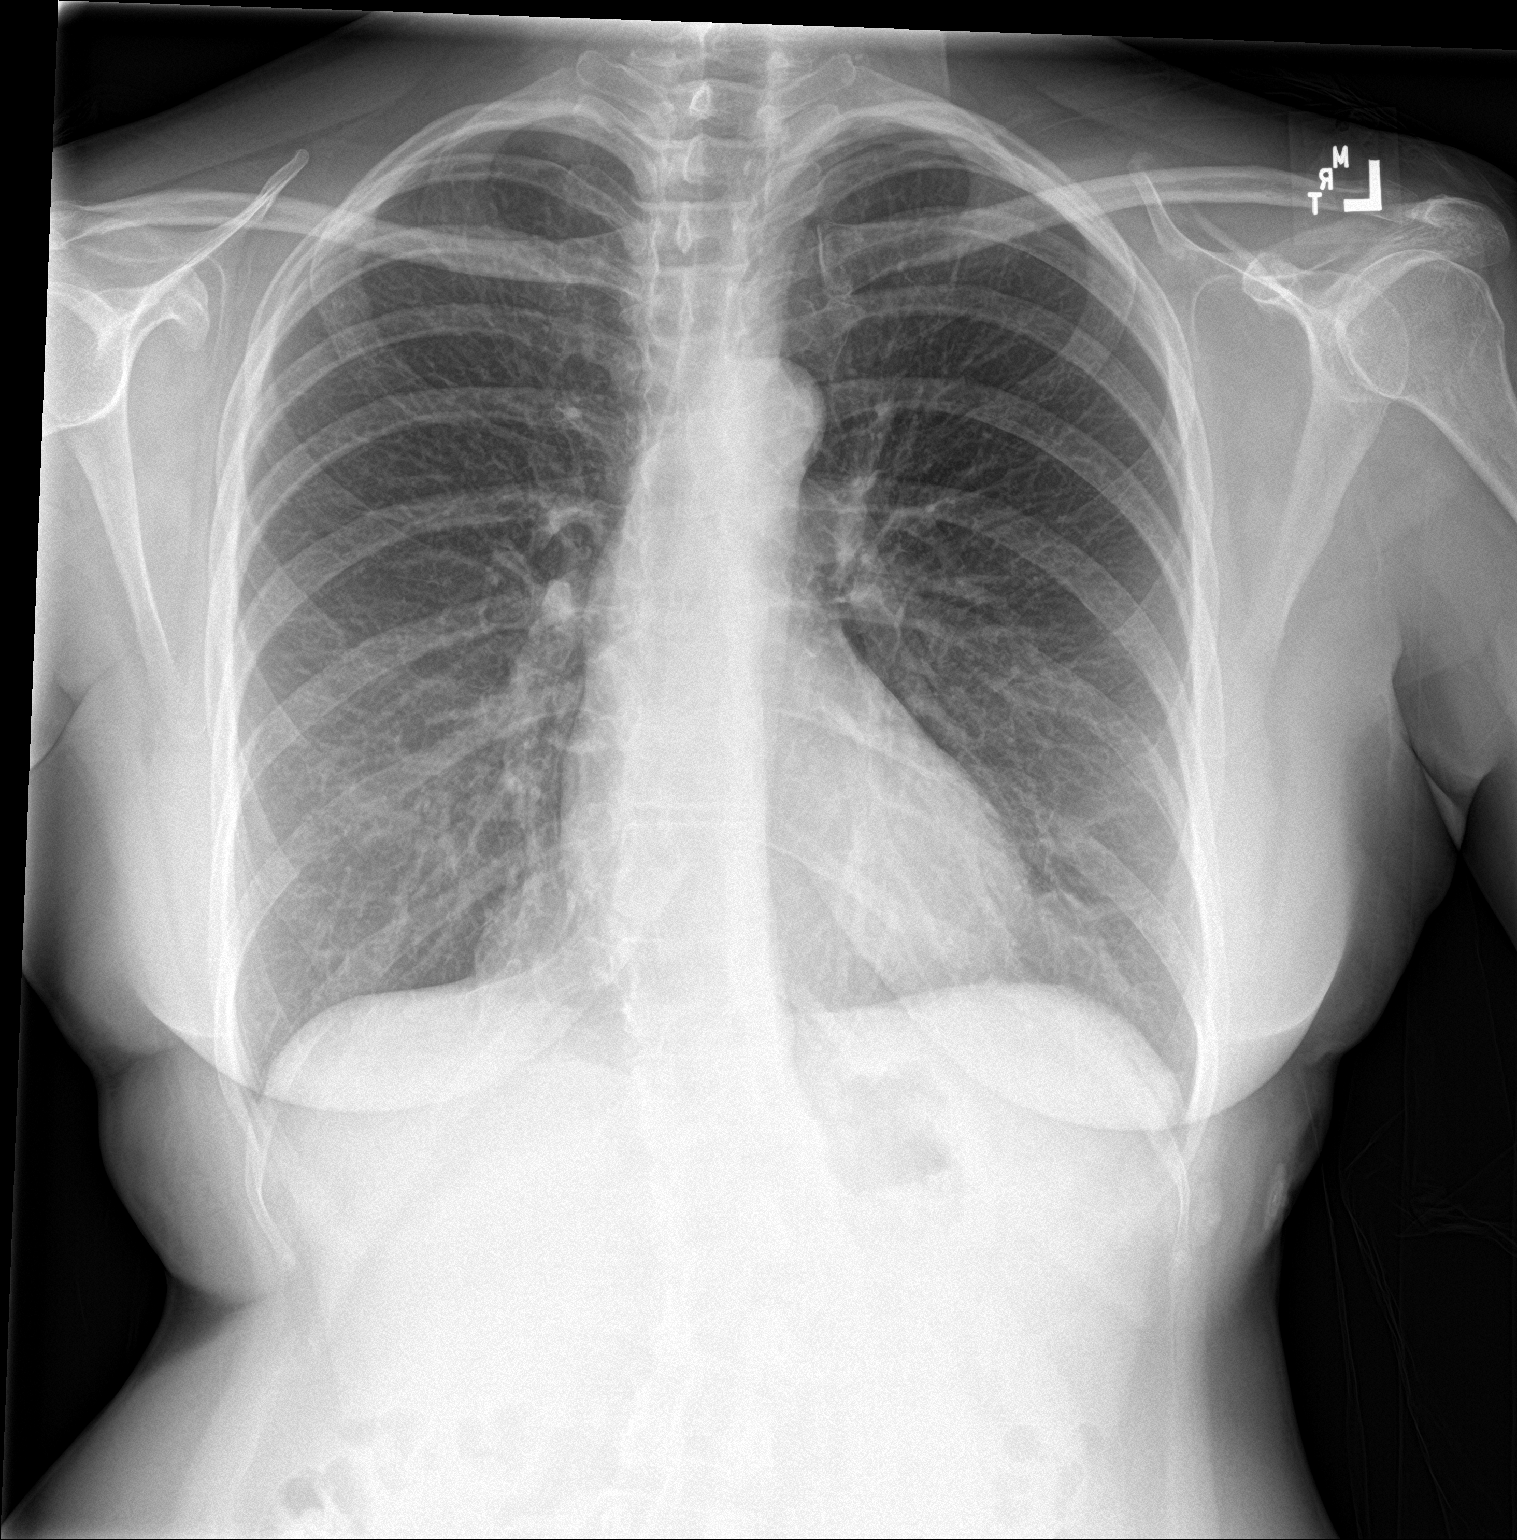

[chest lat]
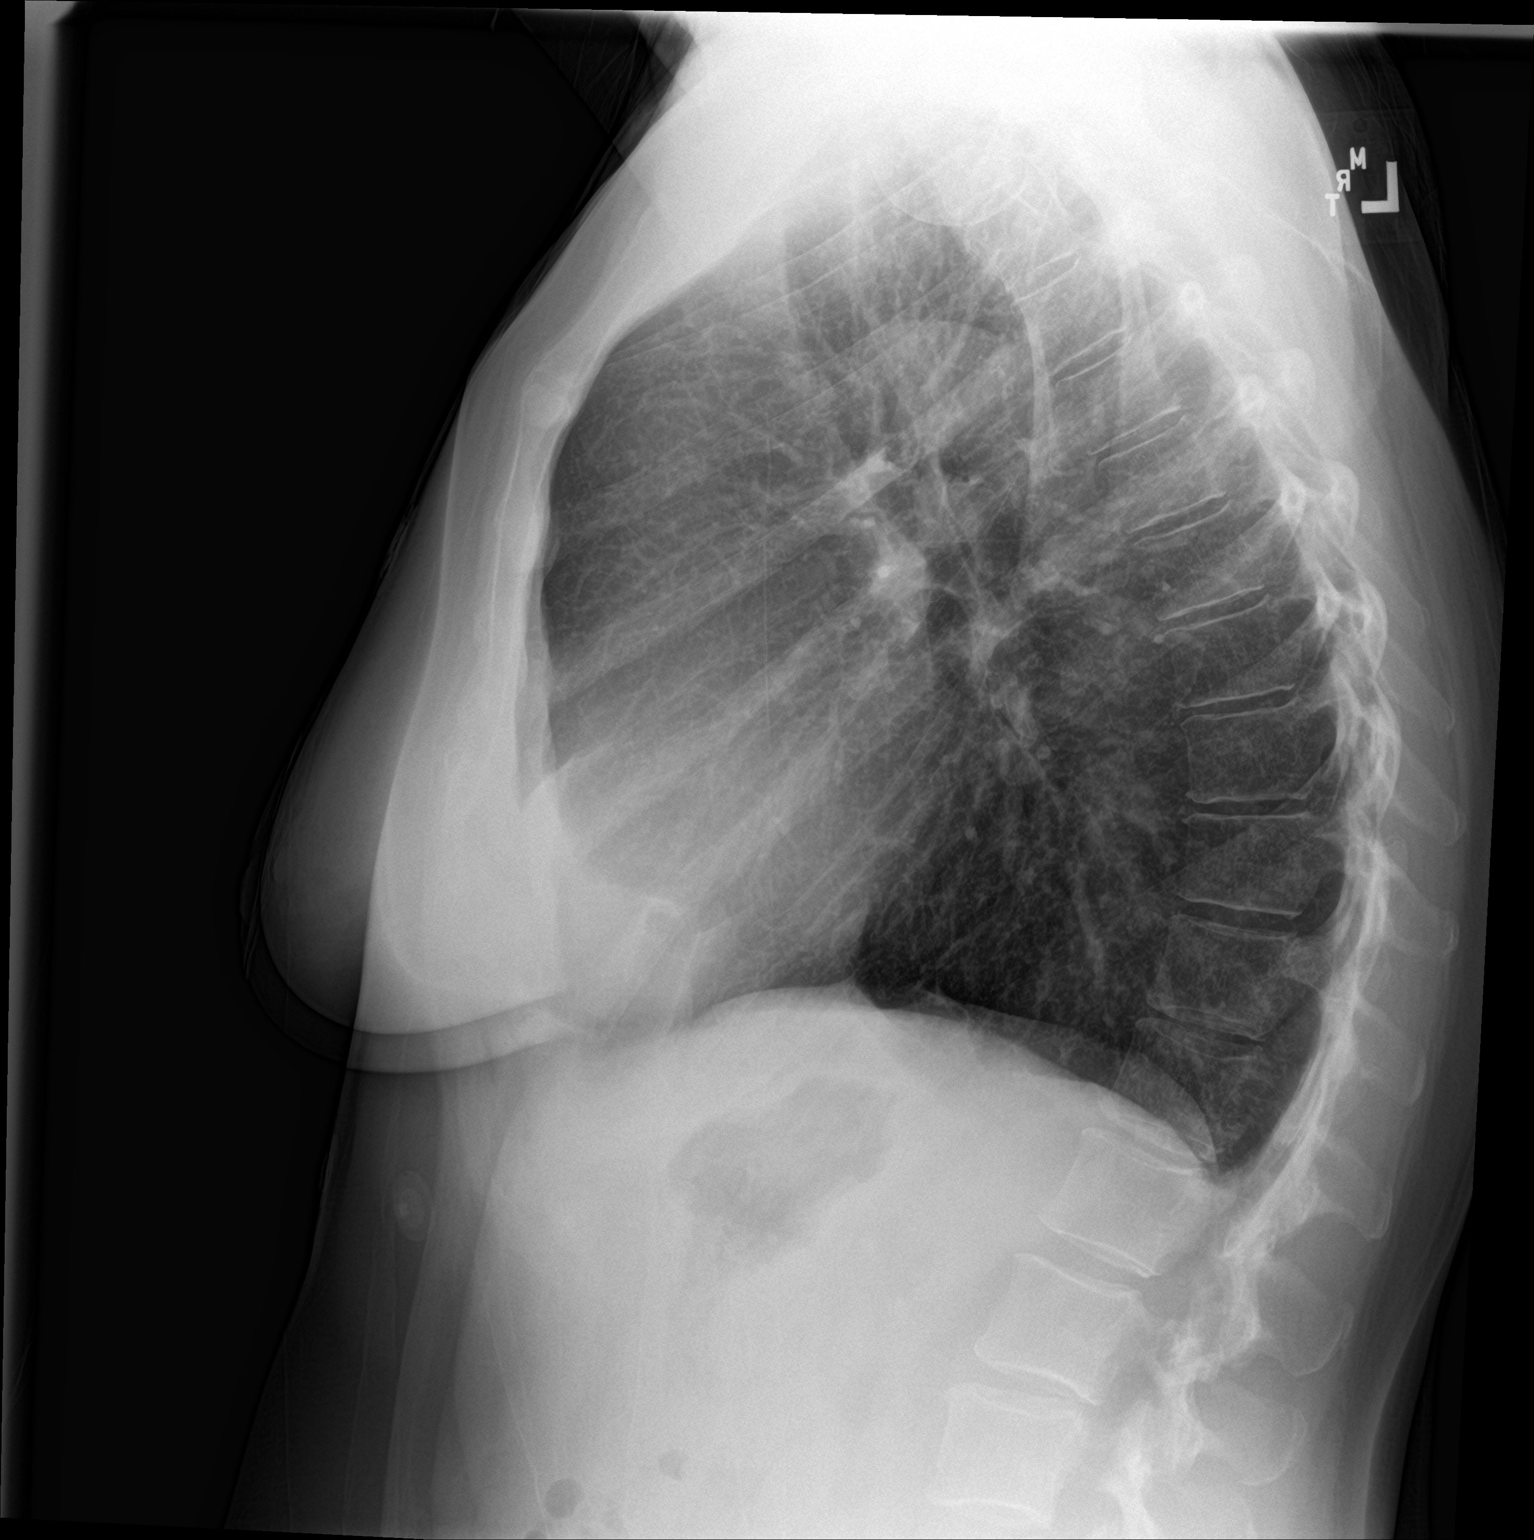

[2 of 2 positions shown; findings below may reference images not displayed]

FINDINGS: The cardiomediastinal contours are normal. Slightly rounded density
adjacent to the right heart border corresponds to mediastinal fat on
prior CT. The lungs are clear. Pulmonary vasculature is normal. No
consolidation, pleural effusion, or pneumothorax. No acute osseous
abnormalities are seen.
IMPRESSION: No acute chest findings.

## 2022-12-25 ENCOUNTER — Ambulatory Visit: Payer: BC Managed Care – PPO | Admitting: Cardiology

## 2022-12-26 ENCOUNTER — Encounter (HOSPITAL_COMMUNITY): Payer: BC Managed Care – PPO

## 2022-12-27 ENCOUNTER — Encounter: Payer: Self-pay | Admitting: Internal Medicine

## 2022-12-27 ENCOUNTER — Ambulatory Visit: Payer: BC Managed Care – PPO | Admitting: Internal Medicine

## 2022-12-27 VITALS — BP 110/70 | HR 71 | Ht 64.0 in | Wt 148.0 lb

## 2022-12-27 DIAGNOSIS — K219 Gastro-esophageal reflux disease without esophagitis: Secondary | ICD-10-CM

## 2022-12-27 DIAGNOSIS — K5900 Constipation, unspecified: Secondary | ICD-10-CM | POA: Diagnosis not present

## 2022-12-27 DIAGNOSIS — R1013 Epigastric pain: Secondary | ICD-10-CM | POA: Diagnosis not present

## 2022-12-27 DIAGNOSIS — K449 Diaphragmatic hernia without obstruction or gangrene: Secondary | ICD-10-CM | POA: Diagnosis not present

## 2022-12-27 MED ORDER — PANTOPRAZOLE SODIUM 40 MG PO TBEC: 40.0000 mg | DELAYED_RELEASE_TABLET | Freq: Two times a day (BID) | ORAL | 1 refills | Status: DC

## 2022-12-27 NOTE — Patient Instructions (Addendum)
We have sent the following medications to your pharmacy for you to pick up at your convenience: Pantoprazole  Start Miralax every other day to help with constipation   Follow up in 6 months   If your blood pressure at your visit was 140/90 or greater, please contact your primary care physician to follow up on this.  _______________________________________________________  If you are age 56 or older, your body mass index should be between 23-30. Your Body mass index is 25.4 kg/m. If this is out of the aforementioned range listed, please consider follow up with your Primary Care Provider.  If you are age 90 or younger, your body mass index should be between 19-25. Your Body mass index is 25.4 kg/m. If this is out of the aformentioned range listed, please consider follow up with your Primary Care Provider.   ________________________________________________________  The [facility] providers would like to encourage you to use [facility] to communicate with providers for non-urgent requests or questions.  Due to long hold times on the telephone, sending your provider a message by [facility] may be a faster and more efficient way to get a response.  Please allow 48 business hours for a response.  Please remember that this is for non-urgent requests.  _______________________________________________________   Thank you for entrusting me with your care and for choosing [facility],  Dr. Eulah Pont

## 2022-12-27 NOTE — Progress Notes (Signed)
Chief Complaint: Noncardiac chest pressure, epigastric abdominal pain  HPI:    Mrs. Lasky is a 57 year old female with a past medical history history of anxiety, hiatal hernia, migraines, osteoporosis and GERD presents for follow-up of noncardiac chest pressure and epigastric abdominal pain  Interval History: She was told that her chest pain episode was a panic attack. She was started Klonopin and has had no more episodes of chest pain. She will take a Klonopin at night time and sometimes in the day time to help with her symptoms. Denies using the GI cocktail due to improvement in her symptoms after she was started on Klonopin. Reflux has been under good control on the PPI BID therapy, and she would like to remain at this dose. She has noticed that her epigastric ab pain has improved with having a BM. She has epigastric ab pain once per week on average. She has 2-3 BMs per week. Sometimes she will take Miralax, which seems to help. She is taking Miralax once a week. Drinking plenty of water and fiber. Fiber supplement would give her gas in the past. She is physically active. Denies dysphagia.   Previous GI workup: Labs 11/2020: TSH nml.    Labs 09/2021: CBC nml. CMP with low potassium of 3. FOBT negative.   Labs 02/2022: Ferritin/IBC nml. IgG nml. ANA negative. ASMA negative. Hep B surface antibody negative. Hep B surface antigen negative. HCV antibody NR. Hep A antibody NR.    Labs 03/2022: CBC nml. CMP with low K of 3.3 and elevated BUN of 29, elevated total bilirubin of 2.1.   Labs 10/2022: CBC unremarkable.  BMP with low potassium at 2.7.   TTE 01/15/20: Echocardiogram 01/15/2020:  Normal LV systolic function with visual EF 60-65%. Left ventricle cavity  is normal in size. Normal global wall motion. Indeterminate diastolic  filling pattern, elevated LAP. No obvious regional wall motion  abnormalities.  Mild (Grade I) mitral regurgitation.  Mild tricuspid regurgitation. No evidence of  pulmonary hypertension. RVSP  measures 33 mmHg.  Mild pulmonic regurgitation.  IVC is dilated with a respiratory response of <50%.    KUB 10/20/21: IMPRESSION: Negative abdominal radiographs.  No acute cardiopulmonary disease.   CT A/P w/contrast 10/23/21: IMPRESSION: 1. Small hiatal hernia. Bowel-gas pattern is nonobstructive. Mild diverticulosis of the colon without diverticulitis. Moderate stool burden in the colon. 2. 4.8 x 3.7 x 3.1 cm simple appearing cystic structure at the left adnexa. Recommend follow-up pelvic US in 6-12 months. Reference: JACR 2020 Feb;17(2):248-254   RUQ U/S 04/03/22: IMPRESSION: No gallstones or ductal dilatation.   EGD 01/04/22: - LA Grade A reflux esophagitis with no bleeding. - Benign-appearing esophageal stenosis. Biopsied. - Medium-sized hiatal hernia. - Erythematous mucosa in the antrum. Biopsied. - Normal examined duodenum. Path: 1. Surgical [P], gastric bx's - ANTRAL AND OXYNTIC MUCOSA WITH NO SIGNIFICANT PATHOLOGIC CHANGES. - NO HELICOBACTER PYLORI IDENTIFIED. 2. Surgical [P], esophageal stricture - GASTROESOPHAGEAL MUCOSA WITH SLIGHT INFLAMMATION CONSISTENT WITH REFLUX.   Colonoscopy 01/04/22: - The examined portion of the ileum was normal. - Two 3 to 4 mm polyps in the sigmoid colon and in the descending colon, removed with a cold biopsy forceps. Resected and retrieved. - Diverticulosis in the sigmoid colon. - Non-bleeding internal hemorrhoids. 3. Surgical [P], colon, descending polyp x1; sigmoid polyp x1, polyp (2) - HYPERPLASTIC POLYP (2).  Past Medical History:  Diagnosis Date   Anxiety    Depression    Hiatal hernia    Hypertension    Migraines  Mitral regurgitation    Osteoporosis    Ovarian cyst    left    Past Surgical History:  Procedure Laterality Date   BUNIONECTOMY Left    COLONOSCOPY     ESOPHAGOGASTRODUODENOSCOPY     KNEE ARTHROCENTESIS Left     Current Outpatient Medications  Medication Sig Dispense  Refill   acetaminophen (TYLENOL) 500 MG tablet Take 1,000 mg by mouth every 6 (six) hours as needed for moderate pain or headache.     AMBULATORY NON FORMULARY MEDICATION Medication Name: 90 ml Lidocaine 2%:90 ml Dicyclomine 10 mg/30ml:270 ml Maalox - Take 5-10 ml every 4-6 hours as needed. 450 mL 1   brexpiprazole (REXULTI) 1 MG TABS tablet Take 1 tablet by mouth daily.     chlorthalidone (HYGROTON) 25 MG tablet Take 25 mg by mouth daily.     clonazePAM (KLONOPIN) 1 MG tablet Take 1 mg by mouth 4 (four) times daily as needed.     diltiazem (TIAZAC) 240 MG 24 hr capsule Take 240 mg by mouth at bedtime.     doxazosin (CARDURA) 2 MG tablet Take 1 tablet by mouth at bedtime.     Esketamine HCl (SPRAVATO, 84 MG DOSE, NA) Place 1 spray into the nose as needed (for depression).     ibandronate (BONIVA) 150 MG tablet Take 150 mg by mouth every 30 (thirty) days. Take in the morning with a full glass of water, on an empty stomach, and do not take anything else by mouth or lie down for the next 30 min.     irbesartan (AVAPRO) 300 MG tablet Take 1 tablet by mouth at bedtime.     L-Methylfolate 15 MG TABS Take 1 tablet by mouth daily.     LAMOTRIGINE PO Take 300 mg by mouth at bedtime.     OnabotulinumtoxinA (BOTOX IJ) Inject 1 Dose as directed every 3 (three) months.     ondansetron (ZOFRAN-ODT) 4 MG disintegrating tablet Take 1 tablet (4 mg total) by mouth every 8 (eight) hours as needed for nausea or vomiting. 20 tablet 0   pantoprazole (PROTONIX) 40 MG tablet TAKE 1 TABLET(40 MG) BY MOUTH TWICE DAILY 180 tablet 1   potassium chloride 20 MEQ/15ML (10%) SOLN Take 10 mEq by mouth daily.     prochlorperazine (COMPAZINE) 10 MG tablet Take 10 mg by mouth every 6 (six) hours as needed for nausea.     rosuvastatin (CRESTOR) 20 MG tablet Take 20 mg by mouth daily.     traZODone (DESYREL) 150 MG tablet Take 150 mg by mouth at bedtime.     vortioxetine HBr (TRINTELLIX) 20 MG TABS tablet Take 20 mg by mouth daily.      No current facility-administered medications for this visit.    Allergies as of 12/27/2022 - Review Complete 12/27/2022  Allergen Reaction Noted   Cefdinir Itching 08/16/2020   Norvasc [amlodipine] Other (See Comments) 08/16/2020    Family History  Problem Relation Age of Onset   Hypertension Mother    Asthma Mother    Stroke Father    Hypertension Father    Parkinson's disease Brother    Uterine cancer Maternal Grandmother        mets to lung   Hypertension Maternal Grandmother    Lung cancer Maternal Grandmother    Liver cancer Maternal Grandfather    Stomach cancer Maternal Grandfather    Diabetes Maternal Grandfather    Parkinson's disease Paternal Grandmother    Heart failure Paternal Grandmother    Heart  attack Paternal Grandfather    Pancreatic cancer Cousin    Colon cancer Cousin    Breast cancer Neg Hx     Social History   Socioeconomic History   Marital status: Single    Spouse name: Not on file   Number of children: 0   Years of education: Not on file   Highest education level: Not on file  Occupational History   Occupation: retired Runner, broadcasting/film/video  Tobacco Use   Smoking status: Never   Smokeless tobacco: Never  Vaping Use   Vaping status: Never Used  Substance and Sexual Activity   Alcohol use: No   Drug use: No   Sexual activity: Not on file  Other Topics Concern   Not on file  Social History Narrative   Not on file   Social Determinants of Health   Financial Resource Strain: Not on file  Food Insecurity: Not on file  Transportation Needs: Not on file  Physical Activity: Not on file  Stress: Not on file  Social Connections: Not on file  Intimate Partner Violence: Not on file    Physical Exam:  Vital signs: BP 110/70   Pulse 71   Ht 5\' 4"  (1.626 m)   Wt 148 lb (67.1 kg)   LMP 10/04/2014   BMI 25.40 kg/m    Constitutional:   Pleasant Caucasian female appears to be in NAD, Well developed, Well nourished, alert and cooperative.   Respiratory: Respirations even and unlabored. Lungs clear to auscultation bilaterally.   No wheezes, crackles, or rhonchi.  Cardiovascular: Normal S1, S2. No MRG. Regular rate. No peripheral edema, cyanosis or pallor.  Gastrointestinal:  Soft, nondistended, nontender. Psychiatric: Demonstrates good judgement and reason without abnormal affect or behaviors.  RELEVANT LABS AND IMAGING: CBC    Component Value Date/Time   WBC 6.4 11/24/2022 1040   RBC 4.53 11/24/2022 1040   HGB 13.3 11/24/2022 1040   HCT 37.8 11/24/2022 1040   PLT 274.0 11/24/2022 1040   MCV 83.4 11/24/2022 1040   MCH 29.0 10/31/2022 2112   MCHC 35.3 11/24/2022 1040   RDW 12.2 11/24/2022 1040   LYMPHSABS 1.1 11/24/2022 1040   MONOABS 0.4 11/24/2022 1040   EOSABS 0.0 11/24/2022 1040   BASOSABS 0.0 11/24/2022 1040    CMP     Component Value Date/Time   NA 136 10/31/2022 2112   NA 145 (H) 01/31/2021 1511   K 2.7 (LL) 10/31/2022 2112   CL 98 10/31/2022 2112   CO2 27 10/31/2022 2112   GLUCOSE 136 (H) 10/31/2022 2112   BUN 13 10/31/2022 2112   BUN 18 01/31/2021 1511   CREATININE 0.87 10/31/2022 2112   CALCIUM 9.3 10/31/2022 2112   PROT 6.9 11/24/2022 1040   ALBUMIN 4.5 11/24/2022 1040   AST 12 11/24/2022 1040   ALT 10 11/24/2022 1040   ALKPHOS 79 11/24/2022 1040   BILITOT 0.9 11/24/2022 1040   GFRNONAA >60 10/31/2022 2112    Assessment: Chest discomfort Epigastric abdominal pain Constipation Elevated LFTs Recent chest discomfort has been attributed to a panic attack and is include improved on Klonopin for therapy.  Patient was recently able to identify that her epigastric abdominal pain improved after having a bowel movement.  Thus her epigastric abdominal pain may be related to uncontrolled constipation.  Patient does endorse only 2-3 bowel movements per week.  MiraLAX seems to be effective so I asked her to increase to MiraLAX to once every other day, and she can titrate this to help achieve an  optimal  number of BMs per week in order to prevent her abdominal pain.  She does still find that PPI twice daily seems to be beneficial so we will continue this.  She has not had to use any of her previously prescribed GI cocktail.  Patient's elevated LFTs has shown a mildly elevated total bilirubin on her last labs in 03/2022.  Will plan to trend this over time.  Plan: - Start Miralax every other day to help with constipation - Cont PPI BID. Will continue now per patient preference. - RTC 6 months  Eulah Pont, MD [facility] 12/27/2022, 9:42 AM  I spent 37 minutes of time, including in depth chart review, independent review of results as outlined above, communicating results with the patient directly, face-to-face time with the patient, coordinating care, and ordering studies and medications as appropriate, and documentation.

## 2023-01-10 NOTE — Progress Notes (Signed)
Cardiology Office Note:    Date:  01/14/2023   ID:  TRELLIS BROUSSARD, DOB 04-16-1965, MRN [id number]  PCP:  Rodrigo Ran, MD  Cardiologist:  Little Ishikawa, MD  Electrophysiologist:  None   Referring MD: Rodrigo Ran, MD   Chief Complaint  Patient presents with   New Patient (Initial Visit)    Establish care.   Chest Pain    History of Present Illness:    Cynthia Roberts is a 57 y.o. female with a hx of mitral regurgitation, hypertension who is referred by Dr. Waynard Edwards for evaluation of mitral regurgitation and hypertension.  Previously followed with Dr. Odis Hollingshead, last seen 11/13/2022.  Ram 11/2019 showed normal LV function, moderate MR, moderate TR.  Exercise Myoview 11/2019 showed good exercise capacity (10.1 METS), normal perfusion, EF 69%.  Echocardiogram 12/2019 showed normal LV function, mild MR and mild TR.  Coronary CTA 01/2021 showed normal coronary arteries, calcium score 0.  She reports she has been having episodes of feeling heart is racing, which happen several times in the last few months.  Heart rate has been up to 110s when checked.  She is feeling lightheaded, but denies any syncope.  Does report chest pain during episodes, describes as in her upper chest.  She does water aerobics for 1 hour several times a week, denies any exertional chest pain or dyspnea.  No smoking history.  Family's includes father died of CVA at 51.   Past Medical History:  Diagnosis Date   Anxiety    Depression    Hiatal hernia    Hypertension    Migraines    Mitral regurgitation    Osteoporosis    Ovarian cyst    left    Past Surgical History:  Procedure Laterality Date   BUNIONECTOMY Left    COLONOSCOPY     ESOPHAGOGASTRODUODENOSCOPY     KNEE ARTHROCENTESIS Left     Current Medications: Current Meds  Medication Sig   acetaminophen (TYLENOL) 500 MG tablet Take 1,000 mg by mouth every 6 (six) hours as needed for moderate pain or headache.   AMBULATORY NON FORMULARY  MEDICATION Medication Name: 90 ml Lidocaine 2%:90 ml Dicyclomine 10 mg/71ml:270 ml Maalox - Take 5-10 ml every 4-6 hours as needed.   brexpiprazole (REXULTI) 1 MG TABS tablet Take 1 tablet by mouth daily.   chlorthalidone (HYGROTON) 25 MG tablet Take 25 mg by mouth daily.   clonazePAM (KLONOPIN) 1 MG tablet Take 1 mg by mouth 4 (four) times daily as needed.   diltiazem (TIAZAC) 240 MG 24 hr capsule Take 240 mg by mouth at bedtime.   doxazosin (CARDURA) 2 MG tablet Take 1 tablet by mouth at bedtime.   Esketamine HCl (SPRAVATO, 84 MG DOSE, NA) Place 1 spray into the nose as needed (for depression).   ibandronate (BONIVA) 150 MG tablet Take 150 mg by mouth every 30 (thirty) days. Take in the morning with a full glass of water, on an empty stomach, and do not take anything else by mouth or lie down for the next 30 min.   irbesartan (AVAPRO) 300 MG tablet Take 1 tablet by mouth at bedtime.   L-Methylfolate 15 MG TABS Take 1 tablet by mouth daily.   LAMOTRIGINE PO Take 300 mg by mouth at bedtime.   OnabotulinumtoxinA (BOTOX IJ) Inject 1 Dose as directed every 3 (three) months.   ondansetron (ZOFRAN-ODT) 4 MG disintegrating tablet Take 1 tablet (4 mg total) by mouth every 8 (eight) hours as needed for  nausea or vomiting.   pantoprazole (PROTONIX) 40 MG tablet Take 1 tablet (40 mg total) by mouth 2 (two) times daily.   potassium chloride 20 MEQ/15ML (10%) SOLN Take 10 mEq by mouth daily.   prochlorperazine (COMPAZINE) 10 MG tablet Take 10 mg by mouth every 6 (six) hours as needed for nausea.   rosuvastatin (CRESTOR) 20 MG tablet Take 20 mg by mouth daily.   traZODone (DESYREL) 150 MG tablet Take 150 mg by mouth at bedtime.   vortioxetine HBr (TRINTELLIX) 20 MG TABS tablet Take 20 mg by mouth daily.     Allergies:   Cefdinir and Norvasc [amlodipine]   Social History   Socioeconomic History   Marital status: Single    Spouse name: Not on file   Number of children: 0   Years of education: Not on  file   Highest education level: Not on file  Occupational History   Occupation: retired Runner, broadcasting/film/video  Tobacco Use   Smoking status: Never   Smokeless tobacco: Never  Vaping Use   Vaping status: Never Used  Substance and Sexual Activity   Alcohol use: No   Drug use: No   Sexual activity: Not on file  Other Topics Concern   Not on file  Social History Narrative   Not on file   Social Determinants of Health   Financial Resource Strain: Not on file  Food Insecurity: Not on file  Transportation Needs: Not on file  Physical Activity: Not on file  Stress: Not on file  Social Connections: Not on file     Family History: The patient's family history includes Asthma in her mother; Colon cancer in her cousin; Diabetes in her maternal grandfather; Heart attack in her paternal grandfather; Heart failure in her paternal grandmother; Hypertension in her father, maternal grandmother, and mother; Liver cancer in her maternal grandfather; Lung cancer in her maternal grandmother; Pancreatic cancer in her cousin; Parkinson's disease in her brother and paternal grandmother; Stomach cancer in her maternal grandfather; Stroke in her father; Uterine cancer in her maternal grandmother. There is no history of Breast cancer.  ROS:   Please see the history of present illness.     All other systems reviewed and are negative.  EKGs/Labs/Other Studies Reviewed:    The following studies were reviewed today:   EKG:   01/11/23: Normal sinus rhythm, rate 63, left axis deviation, LVH   Recent Labs: 11/24/2022: Hemoglobin 13.3; Platelets 274.0 01/11/2023: ALT 13; BUN 14; Creatinine, Ser 0.87; Magnesium 2.2; Potassium 4.2; Sodium 141; TSH 2.000  Recent Lipid Panel No results found for: "CHOL", "TRIG", "HDL", "CHOLHDL", "VLDL", "LDLCALC", "LDLDIRECT"  Physical Exam:    VS:  BP 122/70 (BP Location: Right Arm, Patient Position: Sitting, Cuff Size: Normal)   Pulse 63   Ht 5\' 4"  (1.626 m)   Wt 150 lb (68 kg)    LMP 10/04/2014   BMI 25.75 kg/m     Wt Readings from Last 3 Encounters:  01/11/23 150 lb (68 kg)  12/27/22 148 lb (67.1 kg)  11/24/22 150 lb (68 kg)     GEN:  Well nourished, well developed in no acute distress HEENT: Normal NECK: No JVD; No carotid bruits LYMPHATICS: No lymphadenopathy CARDIAC: RRR, no murmurs, rubs, gallops RESPIRATORY:  Clear to auscultation without rales, wheezing or rhonchi  ABDOMEN: Soft, non-tender, non-distended MUSCULOSKELETAL:  No edema; No deformity  SKIN: Warm and dry NEUROLOGIC:  Alert and oriented x 3 PSYCHIATRIC:  Normal affect   ASSESSMENT:    1.  Precordial pain   2. Chest pressure   3. Benign hypertension   4. Hyperlipidemia, unspecified hyperlipidemia type    PLAN:    Palpitations: Description concerning for arrhythmia, evaluate with Zio patch x 2 weeks  Chest pain: Coronary CTA 01/2021 showed normal coronary arteries, calcium score 0.  Mitral regurgitation: Moderate on echocardiogram 11/2019 at [place], repeat echo 1 month later at [place] reported as mild MR. -Check echocardiogram  Hypertension: On chlorthalidone 25 mg daily, diltiazem 240 mg daily, doxazosin 2 mg nightly, irbesartan 300 mg nightly -BP appears controlled but concern for secondary causes given requiring multiple antihypertensives.  Has known FMD of carotid arteries, underwent renal artery duplex 11/2022 which showed no renal artery stenosis.  Has had very low potassium (down to 2.7) on prior labs, chlorthalidone use likely contributing, but need to rule out hyperaldosteronism.  Check renin/aldosterone.  Also check TSH, serum metanephrines  FMD: Noted in carotid arteries, no significant stenosis.  Follows with neurology at [facility].  Hyperlipidemia: On rosuvastatin 20 mg daily  RTC in 3 months    Medication Adjustments/Labs and Tests Ordered: Current medicines are reviewed at length with the patient today.  Concerns regarding medicines are outlined above.  Orders  Placed This Encounter  Procedures   Comprehensive Metabolic Panel (CMET)   Magnesium   TSH   Metanephrines, plasma   Aldosterone + renin activity w/ ratio   EKG 12-Lead   ECHOCARDIOGRAM COMPLETE   No orders of the defined types were placed in this encounter.   Patient Instructions  Medication Instructions:  Continue all current medications *If you need a refill on your cardiac medications before your next appointment, please call your pharmacy*   Lab Work: Cmet, mg,TSH, Renin Aldosterone, Metananephrine If you have labs (blood work) drawn today and your tests are completely normal, you will receive your results only by: MyChart Message (if you have MyChart) OR A paper copy in the mail If you have any lab test that is abnormal or we need to change your treatment, we will call you to review the results.   Testing/Procedures: ECHO Your physician has requested that you have an echocardiogram. Echocardiography is a painless test that uses sound waves to create images of your heart. It provides your doctor with information about the size and shape of your heart and how well your heart's chambers and valves are working. This procedure takes approximately one hour. There are no restrictions for this procedure. Please do NOT wear cologne, perfume, aftershave, or lotions (deodorant is allowed). Please arrive 15 minutes prior to your appointment time.    ZIO XT- Long Term Monitor Instructions  Your physician has requested you wear a ZIO patch monitor for 14 days.  This is a single patch monitor. [employer] supplies one patch monitor per enrollment. Additional stickers are not available. Please do not apply patch if you will be having a Nuclear Stress Test,  Echocardiogram, Cardiac CT, MRI, or Chest Xray during the period you would be wearing the  monitor. The patch cannot be worn during these tests. You cannot remove and re-apply the  ZIO XT patch monitor.  Your ZIO patch monitor will be  mailed 3 day USPS to your address on file. It may take 3-5 days  to receive your monitor after you have been enrolled.  Once you have received your monitor, please review the enclosed instructions. Your monitor  has already been registered assigning a specific monitor serial # to you.  Billing and Patient Assistance Program Information  We have supplied [employer] with any of your insurance information on file for billing purposes. [employer] offers a sliding scale Patient Assistance Program for patients that do not have  insurance, or whose insurance does not completely cover the cost of the ZIO monitor.  You must apply for the Patient Assistance Program to qualify for this discounted rate.  To apply, please call [employer] at [phone], select option 4, select option 2, ask to apply for  Patient Assistance Program. Meredeth Ide will ask your household income, and how many people  are in your household. They will quote your out-of-pocket cost based on that information.  [employer] will also be able to set up a 69-month, interest-free payment plan if needed.  Applying the monitor   Shave hair from upper left chest.  Hold abrader disc by orange tab. Rub abrader in 40 strokes over the upper left chest as  indicated in your monitor instructions.  Clean area with 4 enclosed alcohol pads. Let dry.  Apply patch as indicated in monitor instructions. Patch will be placed under collarbone on left  side of chest with arrow pointing upward.  Rub patch adhesive wings for 2 minutes. Remove white label marked "1". Remove the white  label marked "2". Rub patch adhesive wings for 2 additional minutes.  While looking in a mirror, press and release button in center of patch. A small green light will  flash 3-4 times. This will be your only indicator that the monitor has been turned on.  Do not shower for the first 24 hours. You may shower after the first 24 hours.  Press the button if you feel a symptom. You will hear  a small click. Record Date, Time and  Symptom in the Patient Logbook.  When you are ready to remove the patch, follow instructions on the last 2 pages of Patient  Logbook. Stick patch monitor onto the last page of Patient Logbook.  Place Patient Logbook in the blue and white box. Use locking tab on box and tape box closed  securely. The blue and white box has prepaid postage on it. Please place it in the mailbox as  soon as possible. Your physician should have your test results approximately 7 days after the  monitor has been mailed back to [facility].  Call [facility] Customer Care at [phone] if you have questions regarding  your ZIO XT patch monitor. Call them immediately if you see an orange light blinking on your  monitor.  If your monitor falls off in less than 4 days, contact our Monitor department at [phone].  If your monitor becomes loose or falls off after 4 days call [employer] at [phone] for  suggestions on securing your monitor    Follow-Up: At [facility], you and your health needs are our priority.  As part of our continuing mission to provide you with exceptional heart care, we have created designated Provider Care Teams.  These Care Teams include your primary Cardiologist (physician) and Advanced Practice Providers (APPs -  Physician Assistants and Nurse Practitioners) who all work together to provide you with the care you need, when you need it.  We recommend signing up for the patient portal called "MyChart".  Sign up information is provided on this After Visit Summary.  MyChart is used to connect with patients for Virtual Visits (Telemedicine).  Patients are able to view lab/test results, encounter notes, upcoming appointments, etc.  Non-urgent messages can be sent to your provider as well.   To learn  more about what you can do with MyChart, go to [email or web address].    Your next appointment:   3 month(s)  Provider:   Little Ishikawa, MD       Signed, Little Ishikawa, MD  01/14/2023 10:15 PM    [facility]

## 2023-01-11 ENCOUNTER — Ambulatory Visit: Payer: BC Managed Care – PPO | Attending: Cardiology

## 2023-01-11 ENCOUNTER — Other Ambulatory Visit: Payer: Self-pay | Admitting: Cardiology

## 2023-01-11 ENCOUNTER — Ambulatory Visit: Payer: BC Managed Care – PPO | Attending: Cardiology | Admitting: Cardiology

## 2023-01-11 ENCOUNTER — Encounter: Payer: Self-pay | Admitting: Cardiology

## 2023-01-11 VITALS — BP 122/70 | HR 63 | Ht 64.0 in | Wt 150.0 lb

## 2023-01-11 DIAGNOSIS — E785 Hyperlipidemia, unspecified: Secondary | ICD-10-CM

## 2023-01-11 DIAGNOSIS — R0789 Other chest pain: Secondary | ICD-10-CM

## 2023-01-11 DIAGNOSIS — I444 Left anterior fascicular block: Secondary | ICD-10-CM

## 2023-01-11 DIAGNOSIS — R072 Precordial pain: Secondary | ICD-10-CM | POA: Diagnosis not present

## 2023-01-11 DIAGNOSIS — I10 Essential (primary) hypertension: Secondary | ICD-10-CM

## 2023-01-11 NOTE — Patient Instructions (Signed)
Medication Instructions:  Continue all current medications *If you need a refill on your cardiac medications before your next appointment, please call your pharmacy*   Lab Work: Cmet, mg,TSH, Renin Aldosterone, Metananephrine If you have labs (blood work) drawn today and your tests are completely normal, you will receive your results only by: MyChart Message (if you have MyChart) OR A paper copy in the mail If you have any lab test that is abnormal or we need to change your treatment, we will call you to review the results.   Testing/Procedures: ECHO Your physician has requested that you have an echocardiogram. Echocardiography is a painless test that uses sound waves to create images of your heart. It provides your doctor with information about the size and shape of your heart and how well your heart's chambers and valves are working. This procedure takes approximately one hour. There are no restrictions for this procedure. Please do NOT wear cologne, perfume, aftershave, or lotions (deodorant is allowed). Please arrive 15 minutes prior to your appointment time.    ZIO XT- Long Term Monitor Instructions  Your physician has requested you wear a ZIO patch monitor for 14 days.  This is a single patch monitor. [employer] supplies one patch monitor per enrollment. Additional stickers are not available. Please do not apply patch if you will be having a Nuclear Stress Test,  Echocardiogram, Cardiac CT, MRI, or Chest Xray during the period you would be wearing the  monitor. The patch cannot be worn during these tests. You cannot remove and re-apply the  ZIO XT patch monitor.  Your ZIO patch monitor will be mailed 3 day USPS to your address on file. It may take 3-5 days  to receive your monitor after you have been enrolled.  Once you have received your monitor, please review the enclosed instructions. Your monitor  has already been registered assigning a specific monitor serial # to  you.  Billing and Patient Assistance Program Information  We have supplied [employer] with any of your insurance information on file for billing purposes. [employer] offers a sliding scale Patient Assistance Program for patients that do not have  insurance, or whose insurance does not completely cover the cost of the ZIO monitor.  You must apply for the Patient Assistance Program to qualify for this discounted rate.  To apply, please call [employer] at [phone], select option 4, select option 2, ask to apply for  Patient Assistance Program. Meredeth Ide will ask your household income, and how many people  are in your household. They will quote your out-of-pocket cost based on that information.  [employer] will also be able to set up a 42-month, interest-free payment plan if needed.  Applying the monitor   Shave hair from upper left chest.  Hold abrader disc by orange tab. Rub abrader in 40 strokes over the upper left chest as  indicated in your monitor instructions.  Clean area with 4 enclosed alcohol pads. Let dry.  Apply patch as indicated in monitor instructions. Patch will be placed under collarbone on left  side of chest with arrow pointing upward.  Rub patch adhesive wings for 2 minutes. Remove white label marked "1". Remove the white  label marked "2". Rub patch adhesive wings for 2 additional minutes.  While looking in a mirror, press and release button in center of patch. A small green light will  flash 3-4 times. This will be your only indicator that the monitor has been turned on.  Do not shower for the first  24 hours. You may shower after the first 24 hours.  Press the button if you feel a symptom. You will hear a small click. Record Date, Time and  Symptom in the Patient Logbook.  When you are ready to remove the patch, follow instructions on the last 2 pages of Patient  Logbook. Stick patch monitor onto the last page of Patient Logbook.  Place Patient Logbook in the blue and white box.  Use locking tab on box and tape box closed  securely. The blue and white box has prepaid postage on it. Please place it in the mailbox as  soon as possible. Your physician should have your test results approximately 7 days after the  monitor has been mailed back to [facility].  Call [facility] Customer Care at [phone] if you have questions regarding  your ZIO XT patch monitor. Call them immediately if you see an orange light blinking on your  monitor.  If your monitor falls off in less than 4 days, contact our Monitor department at [phone].  If your monitor becomes loose or falls off after 4 days call [employer] at [phone] for  suggestions on securing your monitor    Follow-Up: At [facility], you and your health needs are our priority.  As part of our continuing mission to provide you with exceptional heart care, we have created designated Provider Care Teams.  These Care Teams include your primary Cardiologist (physician) and Advanced Practice Providers (APPs -  Physician Assistants and Nurse Practitioners) who all work together to provide you with the care you need, when you need it.  We recommend signing up for the patient portal called "MyChart".  Sign up information is provided on this After Visit Summary.  MyChart is used to connect with patients for Virtual Visits (Telemedicine).  Patients are able to view lab/test results, encounter notes, upcoming appointments, etc.  Non-urgent messages can be sent to your provider as well.   To learn more about what you can do with MyChart, go to [email or web address].    Your next appointment:   3 month(s)  Provider:   Little Ishikawa, MD

## 2023-01-11 NOTE — Progress Notes (Unsigned)
Enrolled for [employer] to mail a ZIO XT long term holter monitor to the patients address on file.  

## 2023-01-12 ENCOUNTER — Inpatient Hospital Stay (HOSPITAL_COMMUNITY): Admission: RE | Admit: 2023-01-12 | Payer: BC Managed Care – PPO | Source: Ambulatory Visit

## 2023-01-14 DIAGNOSIS — R072 Precordial pain: Secondary | ICD-10-CM | POA: Diagnosis not present

## 2023-01-14 DIAGNOSIS — R0789 Other chest pain: Secondary | ICD-10-CM | POA: Diagnosis not present

## 2023-01-14 DIAGNOSIS — I444 Left anterior fascicular block: Secondary | ICD-10-CM | POA: Diagnosis not present

## 2023-01-17 LAB — COMPREHENSIVE METABOLIC PANEL
ALT: 13 [IU]/L (ref 0–32)
AST: 17 [IU]/L (ref 0–40)
Albumin: 4.9 g/dL (ref 3.8–4.9)
Alkaline Phosphatase: 101 [IU]/L (ref 44–121)
BUN/Creatinine Ratio: 16 (ref 9–23)
BUN: 14 mg/dL (ref 6–24)
Bilirubin Total: 0.6 mg/dL (ref 0.0–1.2)
CO2: 28 mmol/L (ref 20–29)
Calcium: 10.3 mg/dL — ABNORMAL HIGH (ref 8.7–10.2)
Chloride: 99 mmol/L (ref 96–106)
Creatinine, Ser: 0.87 mg/dL (ref 0.57–1.00)
Globulin, Total: 1.7 g/dL (ref 1.5–4.5)
Glucose: 105 mg/dL — ABNORMAL HIGH (ref 70–99)
Potassium: 4.2 mmol/L (ref 3.5–5.2)
Sodium: 141 mmol/L (ref 134–144)
Total Protein: 6.6 g/dL (ref 6.0–8.5)
eGFR: 78 mL/min/{1.73_m2} (ref >59)

## 2023-01-17 LAB — ALDOSTERONE + RENIN ACTIVITY W/ RATIO
Aldos/Renin Ratio: 22.6 (ref 0.0–30.0)
Renin Activity, Plasma: 0.19 ng/mL/h (ref 0.167–5.380)
Renin Activity, Plasma: 4.3 ng/mL/h (ref 0.167–5.380)

## 2023-01-17 LAB — METANEPHRINES, PLASMA: Normetanephrine, Free: 189.9 pg/mL (ref 0.0–244.0)

## 2023-01-17 LAB — MAGNESIUM: Magnesium: 2.2 mg/dL (ref 1.6–2.3)

## 2023-01-17 LAB — TSH: TSH: 2 u[IU]/mL (ref 0.450–4.500)

## 2023-01-29 ENCOUNTER — Other Ambulatory Visit (HOSPITAL_COMMUNITY): Payer: Self-pay | Admitting: *Deleted

## 2023-01-30 IMAGING — MG MM DIGITAL SCREENING BILAT W/ TOMO AND CAD
8 series · 9 of 24 positions shown · non-contrast
Comparison: Previous exam(s).

CLINICAL DATA: Screening.

EXAM:
DIGITAL SCREENING BILATERAL MAMMOGRAM WITH TOMOSYNTHESIS AND CAD
TECHNIQUE: Bilateral screening digital craniocaudal and mediolateral oblique
mammograms were obtained. Bilateral screening digital breast
tomosynthesis was performed. The images were evaluated with
computer-aided detection.

[L MLO synth-2D]
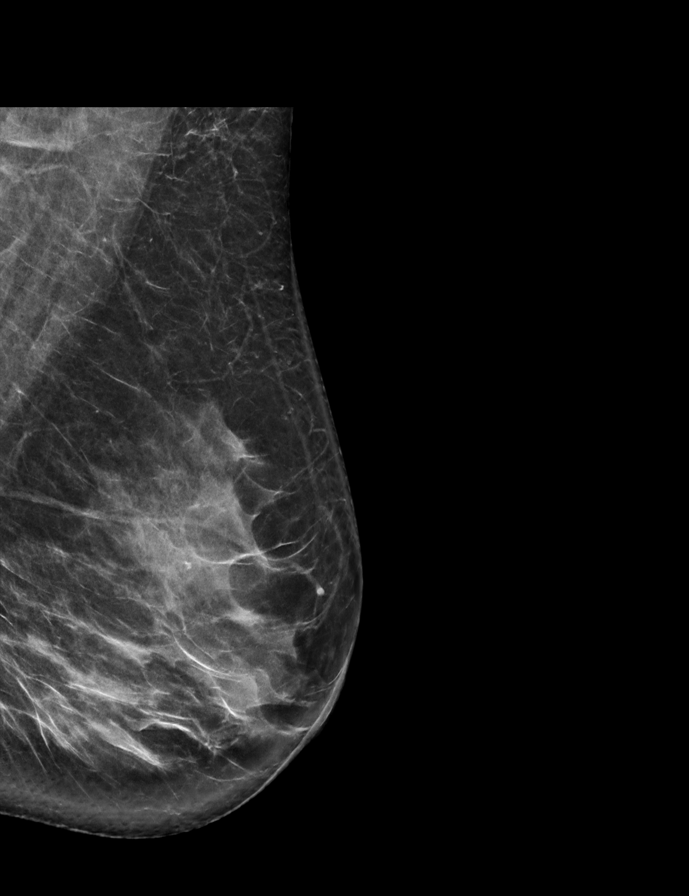

[R MLO synth-2D]
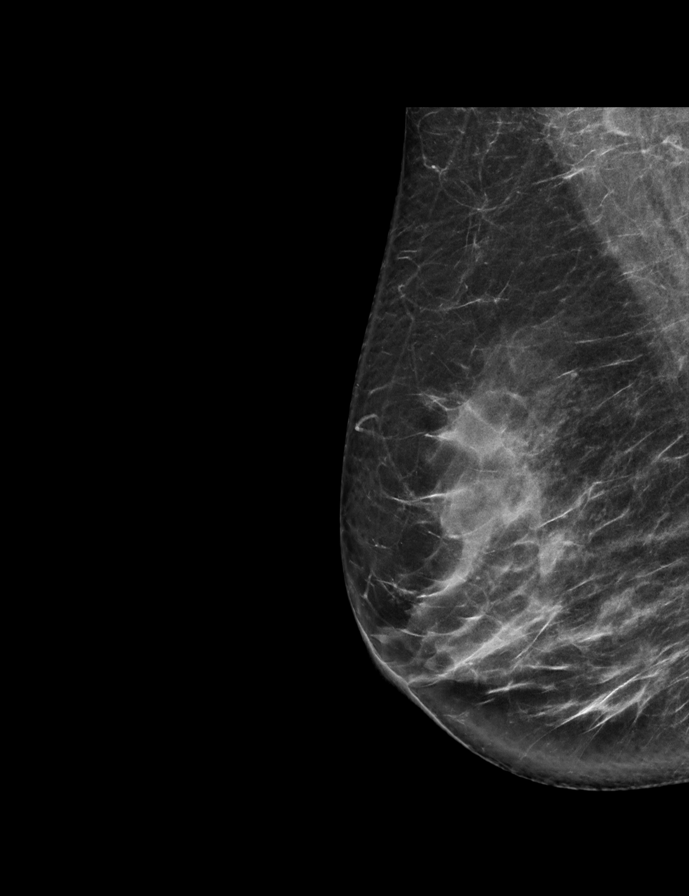

[L CC synth-2D]
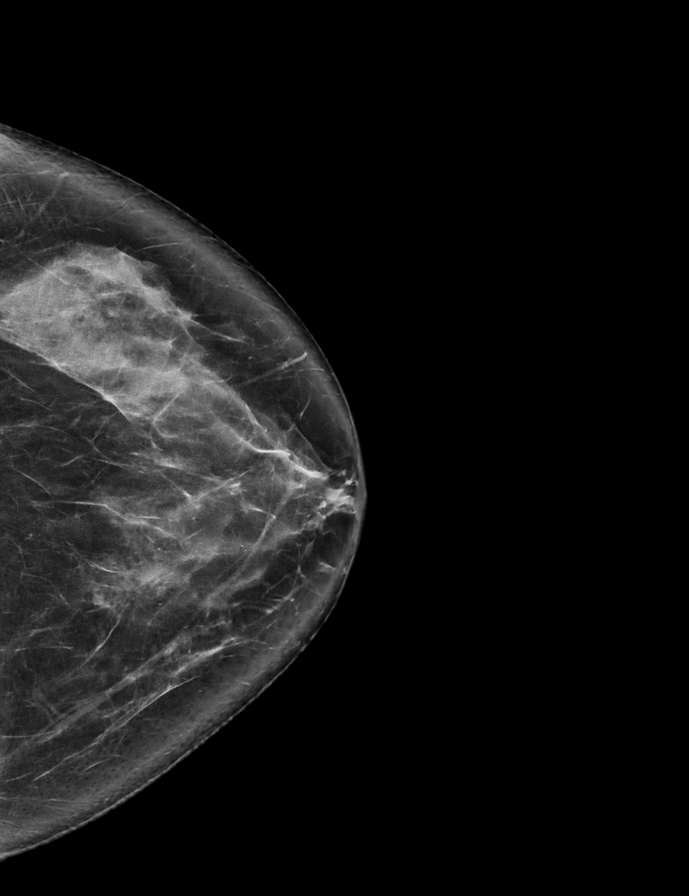

[R CC synth-2D]
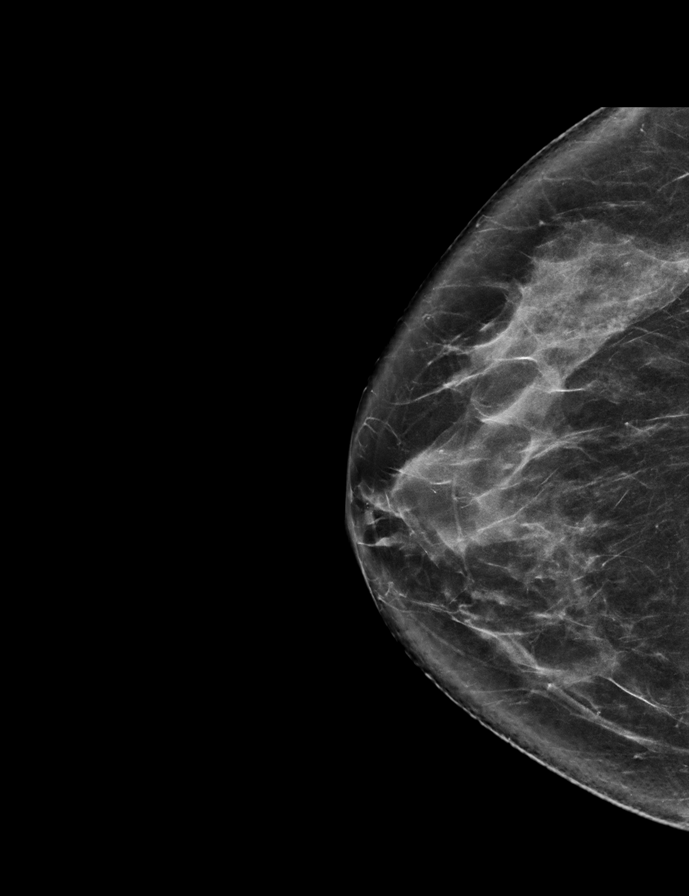

[R CC tomo · 2 of 74 frames shown]
[frame 24/74]
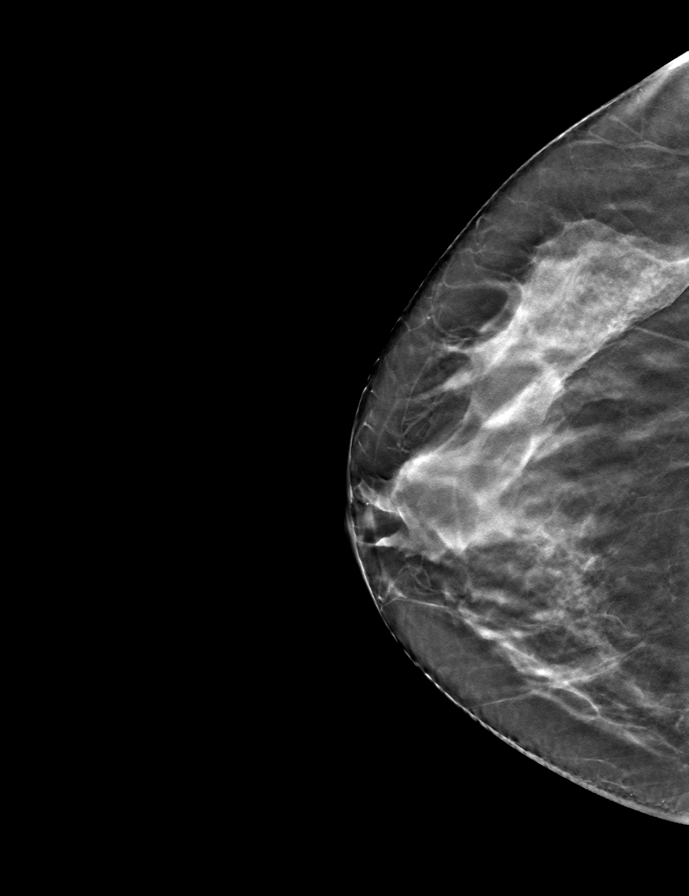
[frame 37/74]
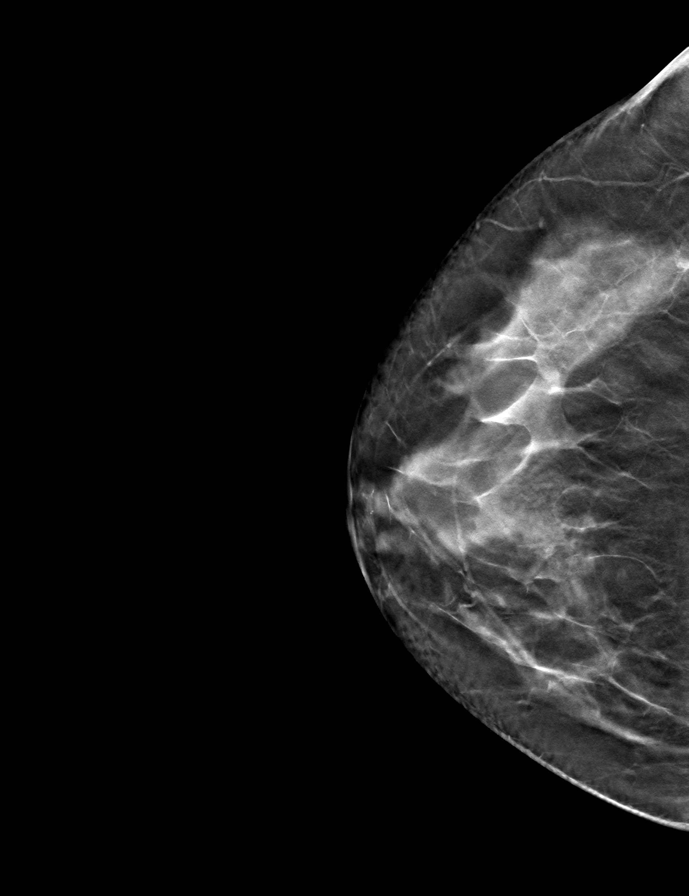

[R MLO tomo · tomo slice 37/72.0]
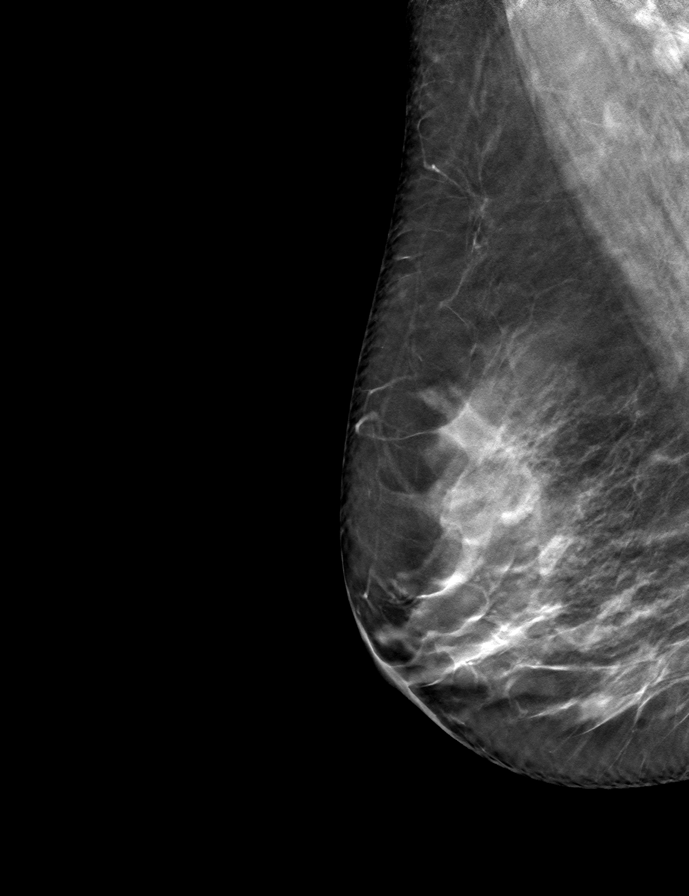

[L MLO tomo · tomo slice 39/76.0]
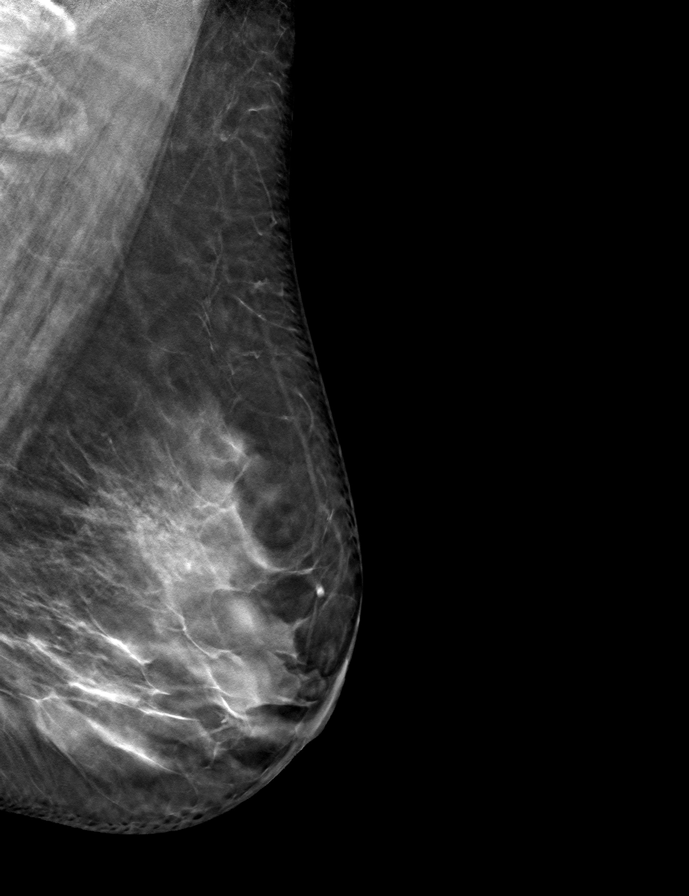

[L CC tomo · tomo slice 37/74.0]
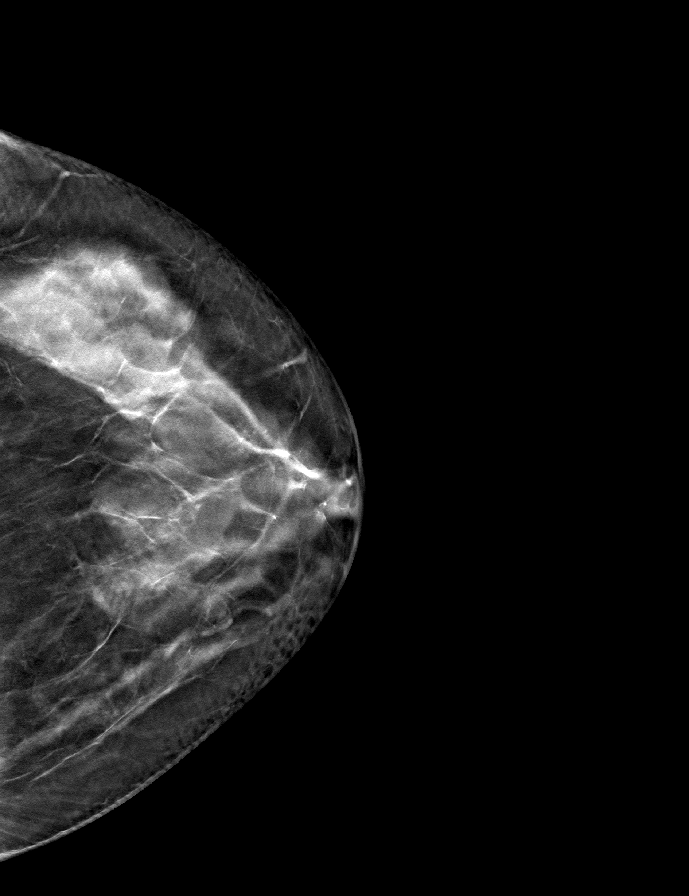

[9 of 24 positions shown; findings below may reference images not displayed]

ACR Breast Density Category c: The breast tissue is heterogeneously
dense, which may obscure small masses.
FINDINGS: There are no findings suspicious for malignancy.
IMPRESSION: No mammographic evidence of malignancy. A result letter of this
screening mammogram will be mailed directly to the patient.

RECOMMENDATION:
Screening mammogram in one year. (Code:Q3-W-BC3)

BI-RADS CATEGORY  1: Negative.

## 2023-01-31 ENCOUNTER — Ambulatory Visit (HOSPITAL_COMMUNITY)
Admission: RE | Admit: 2023-01-31 | Discharge: 2023-01-31 | Disposition: A | Discharge status: Home / Self Care | Payer: BC Managed Care – PPO | Source: Ambulatory Visit | Attending: Internal Medicine | Admitting: Internal Medicine

## 2023-01-31 DIAGNOSIS — M810 Age-related osteoporosis without current pathological fracture: Secondary | ICD-10-CM | POA: Diagnosis present

## 2023-01-31 MED ORDER — IBANDRONATE SODIUM 3 MG/3ML IV SOLN
3.0000 mg | Freq: Once | INTRAVENOUS | Status: AC
  Administered 2023-01-31: 3 mg via INTRAVENOUS
  Filled 2023-01-31: qty 3

## 2023-02-13 ENCOUNTER — Other Ambulatory Visit: Payer: Self-pay | Admitting: Neurology

## 2023-02-13 DIAGNOSIS — Z8249 Family history of ischemic heart disease and other diseases of the circulatory system: Secondary | ICD-10-CM

## 2023-02-13 DIAGNOSIS — I773 Arterial fibromuscular dysplasia: Secondary | ICD-10-CM

## 2023-02-14 ENCOUNTER — Other Ambulatory Visit: Payer: Self-pay | Admitting: Neurology

## 2023-02-14 ENCOUNTER — Encounter: Payer: Self-pay | Admitting: Neurology

## 2023-02-14 DIAGNOSIS — Z8249 Family history of ischemic heart disease and other diseases of the circulatory system: Secondary | ICD-10-CM

## 2023-02-14 DIAGNOSIS — I773 Arterial fibromuscular dysplasia: Secondary | ICD-10-CM

## 2023-02-17 ENCOUNTER — Ambulatory Visit
Admission: RE | Admit: 2023-02-17 | Discharge: 2023-02-17 | Disposition: A | Discharge status: Home / Self Care | Payer: BC Managed Care – PPO | Source: Ambulatory Visit | Attending: Neurology | Admitting: Neurology

## 2023-02-17 DIAGNOSIS — I773 Arterial fibromuscular dysplasia: Secondary | ICD-10-CM

## 2023-02-17 DIAGNOSIS — Z8249 Family history of ischemic heart disease and other diseases of the circulatory system: Secondary | ICD-10-CM

## 2023-02-19 ENCOUNTER — Ambulatory Visit (HOSPITAL_COMMUNITY): Payer: BC Managed Care – PPO | Attending: Cardiology

## 2023-02-19 ENCOUNTER — Other Ambulatory Visit: Payer: BC Managed Care – PPO

## 2023-02-19 DIAGNOSIS — I340 Nonrheumatic mitral (valve) insufficiency: Secondary | ICD-10-CM | POA: Diagnosis present

## 2023-02-19 LAB — ECHOCARDIOGRAM COMPLETE
Area-P 1/2: 3.6 cm2
S' Lateral: 3.2 cm

## 2023-02-20 ENCOUNTER — Encounter: Payer: Self-pay | Admitting: Cardiology

## 2023-03-16 NOTE — Telephone Encounter (Signed)
Cynthia Roberts, [place], [place] to Me     03/14/23  3:39 PM It should be fine to take these together. Thanks-JLL

## 2023-03-30 LAB — LAB REPORT - SCANNED
A1c: 5
EGFR: 64.8

## 2023-04-01 NOTE — Progress Notes (Signed)
 Cardiology Office Note:    Date:  04/03/2023   ID:  Cynthia Roberts, DOB April 10, 1965, MRN [id number]  PCP:  Shayne Anes, MD  Cardiologist:  Lonni LITTIE Nanas, MD  Electrophysiologist:  None   Referring MD: Shayne Anes, MD   Chief Complaint  Patient presents with   Hypertension    History of Present Illness:    Cynthia Roberts is a 58 y.o. female with a hx of mitral regurgitation, hypertension who presents for follow-up.  She was referred by Dr. Shayne for evaluation of mitral regurgitation and hypertension, initially seen 01/11/2023.  Previously followed with Dr. Michele, last seen 11/13/2022.  Ram 11/2019 showed normal LV function, moderate MR, moderate TR.  Exercise Myoview 11/2019 showed good exercise capacity (10.1 METS), normal perfusion, EF 69%.  Echocardiogram 12/2019 showed normal LV function, mild MR and mild TR.  Coronary CTA 01/2021 showed normal coronary arteries, calcium score 0.  Zio patch x 14 days 12/2022 showed no significant arrhythmias.  Echocardiogram 02/19/2023 showed normal biventricular function, moderate left atrial enlargement, mild mitral regurgitation.  Since last clinic visit, she reports she is doing well.  Continues to have some chest tightness.  Started Klonopin and feels like this is helping with her chest tightness.  She denies any palpitations, dyspnea, lower extremity edema, lightheadedness, or syncope.   Past Medical History:  Diagnosis Date   Anxiety    Depression    Hiatal hernia    Hypertension    Migraines    Mitral regurgitation    Osteoporosis    Ovarian cyst    left    Past Surgical History:  Procedure Laterality Date   BUNIONECTOMY Left    COLONOSCOPY     ESOPHAGOGASTRODUODENOSCOPY     KNEE ARTHROCENTESIS Left     Current Medications: Current Meds  Medication Sig   acetaminophen  (TYLENOL ) 500 MG tablet Take 1,000 mg by mouth every 6 (six) hours as needed for moderate pain or headache.   AMBULATORY NON FORMULARY MEDICATION  Medication Name: 90 ml Lidocaine  2%:90 ml Dicyclomine  10 mg/42ml:270 ml Maalox - Take 5-10 ml every 4-6 hours as needed.   brexpiprazole (REXULTI) 1 MG TABS tablet Take 1 tablet by mouth daily.   chlorthalidone (HYGROTON) 25 MG tablet Take 25 mg by mouth daily.   clonazePAM (KLONOPIN) 1 MG tablet Take 0.5 mg by mouth 4 (four) times daily as needed.   diltiazem (TIAZAC) 240 MG 24 hr capsule Take 240 mg by mouth at bedtime.   doxazosin (CARDURA) 2 MG tablet Take 1 tablet by mouth at bedtime.   Esketamine HCl (SPRAVATO, 84 MG DOSE, NA) Place 1 spray into the nose as needed (for depression).   ibandronate  (BONIVA ) 150 MG tablet Take 150 mg by mouth every 30 (thirty) days. Take in the morning with a full glass of water, on an empty stomach, and do not take anything else by mouth or lie down for the next 30 min.   irbesartan (AVAPRO) 300 MG tablet Take 1 tablet by mouth at bedtime.   L-Methylfolate 15 MG TABS Take 1 tablet by mouth daily.   LAMOTRIGINE PO Take 300 mg by mouth at bedtime.   OnabotulinumtoxinA (BOTOX IJ) Inject 1 Dose as directed every 3 (three) months.   ondansetron  (ZOFRAN -ODT) 4 MG disintegrating tablet Take 1 tablet (4 mg total) by mouth every 8 (eight) hours as needed for nausea or vomiting.   potassium chloride  20 MEQ/15ML (10%) SOLN Take 10 mEq by mouth daily.   prochlorperazine (COMPAZINE) 10  MG tablet Take 10 mg by mouth every 6 (six) hours as needed for nausea.   rosuvastatin (CRESTOR) 20 MG tablet Take 20 mg by mouth daily.   traZODone (DESYREL) 150 MG tablet Take 150 mg by mouth at bedtime.   vortioxetine HBr (TRINTELLIX) 20 MG TABS tablet Take 20 mg by mouth daily.     Allergies:   Cefdinir and Norvasc [amlodipine]   Social History   Socioeconomic History   Marital status: Single    Spouse name: Not on file   Number of children: 0   Years of education: Not on file   Highest education level: Not on file  Occupational History   Occupation: retired runner, broadcasting/film/video  Tobacco  Use   Smoking status: Never   Smokeless tobacco: Never  Vaping Use   Vaping status: Never Used  Substance and Sexual Activity   Alcohol use: No   Drug use: No   Sexual activity: Not on file  Other Topics Concern   Not on file  Social History Narrative   Not on file   Social Drivers of Health   Financial Resource Strain: Not on file  Food Insecurity: Not on file  Transportation Needs: Not on file  Physical Activity: Not on file  Stress: Not on file  Social Connections: Not on file     Family History: The patient's family history includes Asthma in her mother; Colon cancer in her cousin; Diabetes in her maternal grandfather; Heart attack in her paternal grandfather; Heart failure in her paternal grandmother; Hypertension in her father, maternal grandmother, and mother; Liver cancer in her maternal grandfather; Lung cancer in her maternal grandmother; Pancreatic cancer in her cousin; Parkinson's disease in her brother and paternal grandmother; Stomach cancer in her maternal grandfather; Stroke in her father; Uterine cancer in her maternal grandmother. There is no history of Breast cancer.  ROS:   Please see the history of present illness.     All other systems reviewed and are negative.  EKGs/Labs/Other Studies Reviewed:    The following studies were reviewed today:   EKG:   01/11/23: Normal sinus rhythm, rate 63, left axis deviation, LVH   Recent Labs: 11/24/2022: Hemoglobin 13.3; Platelets 274.0 01/11/2023: ALT 13; BUN 14; Creatinine, Ser 0.87; Magnesium  2.2; Potassium 4.2; Sodium 141; TSH 2.000  Recent Lipid Panel No results found for: CHOL, TRIG, HDL, CHOLHDL, VLDL, LDLCALC, LDLDIRECT  Physical Exam:    VS:  BP 122/76 (BP Location: Left Arm, Patient Position: Sitting, Cuff Size: Normal)   Pulse 64   Ht 5' 4 (1.626 m)   Wt 148 lb 6.4 oz (67.3 kg)   LMP 10/04/2014   SpO2 94%   BMI 25.47 kg/m     Wt Readings from Last 3 Encounters:  04/02/23 148  lb 6.4 oz (67.3 kg)  01/31/23 148 lb (67.1 kg)  01/11/23 150 lb (68 kg)     GEN:  Well nourished, well developed in no acute distress HEENT: Normal NECK: No JVD; No carotid bruits LYMPHATICS: No lymphadenopathy CARDIAC: RRR, no murmurs, rubs, gallops RESPIRATORY:  Clear to auscultation without rales, wheezing or rhonchi  ABDOMEN: Soft, non-tender, non-distended MUSCULOSKELETAL:  No edema; No deformity  SKIN: Warm and dry NEUROLOGIC:  Alert and oriented x 3 PSYCHIATRIC:  Normal affect   ASSESSMENT:    1. Essential hypertension   2. Palpitations   3. Chest pressure   4. Hyperlipidemia, unspecified hyperlipidemia type     PLAN:    Palpitations: Zio patch x 14 days 12/2022  showed no significant arrhythmias.  Echocardiogram 02/19/2023 showed normal biventricular function, moderate left atrial enlargement, mild mitral regurgitation.  Chest pain: Coronary CTA 01/2021 showed normal coronary arteries, calcium score 0.  Mitral regurgitation: Moderate on echocardiogram 11/2019 at [place], repeat echo 1 month later at [place] reported as mild MR. Mild mitral regurgitation on echo 01/2023  Hypertension: On chlorthalidone 25 mg daily, diltiazem 240 mg daily, doxazosin 2 mg nightly, irbesartan 300 mg nightly -BP appears controlled but concern for secondary causes given requiring multiple antihypertensives.  Has known FMD of carotid arteries, underwent renal artery duplex 11/2022 which showed no renal artery stenosis.  Has had very low potassium (down to 2.7) on prior labs, chlorthalidone use likely contributing, but need to rule out hyperaldosteronism.  Renin/aldosterone and serum metanephrines unremarkable 12/2022.  Reports had labs drawn with Dr Shayne last week, will obtain records  FMD: Noted in carotid arteries, no significant stenosis.  Follows with neurology at [facility].  Hyperlipidemia: On rosuvastatin 20 mg daily  RTC in 6 months    Medication Adjustments/Labs and Tests  Ordered: Current medicines are reviewed at length with the patient today.  Concerns regarding medicines are outlined above.  No orders of the defined types were placed in this encounter.  No orders of the defined types were placed in this encounter.   Patient Instructions  Medication Instructions:  Continue current medications *If you need a refill on your cardiac medications before your next appointment, please call your pharmacy*   Lab Work: none If you have labs (blood work) drawn today and your tests are completely normal, you will receive your results only by: MyChart Message (if you have MyChart) OR A paper copy in the mail If you have any lab test that is abnormal or we need to change your treatment, we will call you to review the results.   Testing/Procedures: none   Follow-Up: At [facility], you and your health needs are our priority.  As part of our continuing mission to provide you with exceptional heart care, we have created designated Provider Care Teams.  These Care Teams include your primary Cardiologist (physician) and Advanced Practice Providers (APPs -  Physician Assistants and Nurse Practitioners) who all work together to provide you with the care you need, when you need it.  We recommend signing up for the patient portal called MyChart.  Sign up information is provided on this After Visit Summary.  MyChart is used to connect with patients for Virtual Visits (Telemedicine).  Patients are able to view lab/test results, encounter notes, upcoming appointments, etc.  Non-urgent messages can be sent to your provider as well.   To learn more about what you can do with MyChart, go to [email or web address].    Your next appointment:   6 month(s)  Provider:   Lonni LITTIE Nanas, MD     Other Instructions none        Signed, Lonni LITTIE Nanas, MD  04/03/2023 2:47 PM    [facility]

## 2023-04-02 ENCOUNTER — Other Ambulatory Visit: Payer: Self-pay | Admitting: *Deleted

## 2023-04-02 ENCOUNTER — Encounter: Payer: Self-pay | Admitting: Cardiology

## 2023-04-02 ENCOUNTER — Ambulatory Visit: Payer: 59 | Attending: Cardiology | Admitting: Cardiology

## 2023-04-02 VITALS — BP 122/76 | HR 64 | Ht 64.0 in | Wt 148.4 lb

## 2023-04-02 DIAGNOSIS — R002 Palpitations: Secondary | ICD-10-CM

## 2023-04-02 DIAGNOSIS — R0789 Other chest pain: Secondary | ICD-10-CM | POA: Diagnosis not present

## 2023-04-02 DIAGNOSIS — I10 Essential (primary) hypertension: Secondary | ICD-10-CM

## 2023-04-02 DIAGNOSIS — E785 Hyperlipidemia, unspecified: Secondary | ICD-10-CM | POA: Diagnosis not present

## 2023-04-02 NOTE — Patient Instructions (Signed)
 Medication Instructions:  Continue current medications *If you need a refill on your cardiac medications before your next appointment, please call your pharmacy*   Lab Work: none If you have labs (blood work) drawn today and your tests are completely normal, you will receive your results only by: MyChart Message (if you have MyChart) OR A paper copy in the mail If you have any lab test that is abnormal or we need to change your treatment, we will call you to review the results.   Testing/Procedures: none   Follow-Up: At [facility], you and your health needs are our priority.  As part of our continuing mission to provide you with exceptional heart care, we have created designated Provider Care Teams.  These Care Teams include your primary Cardiologist (physician) and Advanced Practice Providers (APPs -  Physician Assistants and Nurse Practitioners) who all work together to provide you with the care you need, when you need it.  We recommend signing up for the patient portal called "MyChart".  Sign up information is provided on this After Visit Summary.  MyChart is used to connect with patients for Virtual Visits (Telemedicine).  Patients are able to view lab/test results, encounter notes, upcoming appointments, etc.  Non-urgent messages can be sent to your provider as well.   To learn more about what you can do with MyChart, go to [email or web address].    Your next appointment:   6 month(s)  Provider:   Little Ishikawa, MD     Other Instructions none

## 2023-04-06 LAB — LAB REPORT - SCANNED
Albumin, Urine POC: 5.4
Creatinine, POC: 90.9 mg/dL
Microalb Creat Ratio: 6

## 2023-04-13 MED ORDER — DICYCLOMINE HCL 10 MG PO CAPS: 10.0000 mg | ORAL_CAPSULE | Freq: Three times a day (TID) | ORAL | 0 refills | Status: AC | PRN

## 2023-04-16 ENCOUNTER — Encounter (HOSPITAL_COMMUNITY): Payer: BC Managed Care – PPO

## 2023-04-23 ENCOUNTER — Encounter: Payer: Self-pay | Admitting: *Deleted

## 2023-04-23 ENCOUNTER — Encounter: Payer: Self-pay | Admitting: Internal Medicine

## 2023-04-24 LAB — LAB REPORT - SCANNED: A1c: 5

## 2023-04-25 ENCOUNTER — Encounter: Payer: Self-pay | Admitting: Internal Medicine

## 2023-05-03 ENCOUNTER — Inpatient Hospital Stay (HOSPITAL_COMMUNITY): Admission: RE | Admit: 2023-05-03 | Payer: BC Managed Care – PPO | Source: Ambulatory Visit

## 2023-05-11 ENCOUNTER — Encounter (HOSPITAL_COMMUNITY): Payer: Self-pay

## 2023-05-24 ENCOUNTER — Ambulatory Visit (HOSPITAL_COMMUNITY)
Admission: RE | Admit: 2023-05-24 | Discharge: 2023-05-24 | Disposition: A | Discharge status: Home / Self Care | Payer: 59 | Source: Ambulatory Visit | Attending: Internal Medicine | Admitting: Internal Medicine

## 2023-05-24 DIAGNOSIS — M810 Age-related osteoporosis without current pathological fracture: Secondary | ICD-10-CM | POA: Insufficient documentation

## 2023-05-24 MED ORDER — IBANDRONATE SODIUM 3 MG/3ML IV SOLN
3.0000 mg | Freq: Once | INTRAVENOUS | Status: AC
  Administered 2023-05-24: 3 mg via INTRAVENOUS
  Filled 2023-05-24: qty 3

## 2023-07-25 ENCOUNTER — Encounter: Payer: Self-pay | Admitting: Internal Medicine

## 2023-07-25 ENCOUNTER — Ambulatory Visit: Payer: 59 | Admitting: Internal Medicine

## 2023-07-25 VITALS — BP 120/78 | HR 67 | Ht 64.0 in | Wt 154.8 lb

## 2023-07-25 DIAGNOSIS — K5900 Constipation, unspecified: Secondary | ICD-10-CM

## 2023-07-25 DIAGNOSIS — R1013 Epigastric pain: Secondary | ICD-10-CM | POA: Diagnosis not present

## 2023-07-25 DIAGNOSIS — K219 Gastro-esophageal reflux disease without esophagitis: Secondary | ICD-10-CM

## 2023-07-25 DIAGNOSIS — K449 Diaphragmatic hernia without obstruction or gangrene: Secondary | ICD-10-CM

## 2023-07-25 NOTE — Progress Notes (Signed)
 Chief Complaint: GERD  HPI:    Cynthia Roberts is a 58 year old female with a past medical history history of anxiety, hiatal hernia, migraines, osteoporosis and GERD presents for follow-up of GERD and epigastric ab pain  Interval History: She is doing well. Only having rare heartburn. She is now taking Protonix  PRN. She also only has rare ab pain. She is now taking Miralax  every day or every other day. She is having 1 BM every day or every other day with the Miralax . Having some hiccups. She is eating and drinking well. Denies blood in the stools.   Previous GI workup: Labs 11/2020: TSH nml.    Labs 09/2021: CBC nml. CMP with low potassium of 3. FOBT negative.   Labs 02/2022: Ferritin/IBC nml. IgG nml. ANA negative. ASMA negative. Hep B surface antibody negative. Hep B surface antigen negative. HCV antibody NR. Hep A antibody NR.    Labs 03/2022: CBC nml. CMP with low K of 3.3 and elevated BUN of 29, elevated total bilirubin of 2.1.   Labs 10/2022: CBC unremarkable.  BMP with low potassium at 2.7.  Labs 12/2022: CMP with mildly elevated calcium of 10.3. TSH nml.    TTE 01/15/20: Echocardiogram 01/15/2020:  Normal LV systolic function with visual EF 60-65%. Left ventricle cavity  is normal in size. Normal global wall motion. Indeterminate diastolic  filling pattern, elevated LAP. No obvious regional wall motion  abnormalities.  Mild (Grade I) mitral regurgitation.  Mild tricuspid regurgitation. No evidence of pulmonary hypertension. RVSP  measures 33 mmHg.  Mild pulmonic regurgitation.  IVC is dilated with a respiratory response of <50%.    KUB 10/20/21: IMPRESSION: Negative abdominal radiographs.  No acute cardiopulmonary disease.   CT A/P w/contrast 10/23/21: IMPRESSION: 1. Small hiatal hernia. Bowel-gas pattern is nonobstructive. Mild diverticulosis of the colon without diverticulitis. Moderate stool burden in the colon. 2. 4.8 x 3.7 x 3.1 cm simple appearing cystic structure at  the left adnexa. Recommend follow-up pelvic US  in 6-12 months. Reference: JACR 2020 Feb;17(2):248-254   RUQ U/S 04/03/22: IMPRESSION: No gallstones or ductal dilatation.   EGD 01/04/22: - LA Grade A reflux esophagitis with no bleeding. - Benign-appearing esophageal stenosis. Biopsied. - Medium-sized hiatal hernia. - Erythematous mucosa in the antrum. Biopsied. - Normal examined duodenum. Path: 1. Surgical [P], gastric bx's - ANTRAL AND OXYNTIC MUCOSA WITH NO SIGNIFICANT PATHOLOGIC CHANGES. - NO HELICOBACTER PYLORI IDENTIFIED. 2. Surgical [P], esophageal stricture - GASTROESOPHAGEAL MUCOSA WITH SLIGHT INFLAMMATION CONSISTENT WITH REFLUX.   Colonoscopy 01/04/22: - The examined portion of the ileum was normal. - Two 3 to 4 mm polyps in the sigmoid colon and in the descending colon, removed with a cold biopsy forceps. Resected and retrieved. - Diverticulosis in the sigmoid colon. - Non-bleeding internal hemorrhoids. 3. Surgical [P], colon, descending polyp x1; sigmoid polyp x1, polyp (2) - HYPERPLASTIC POLYP (2).  Past Medical History:  Diagnosis Date   Anxiety    Depression    Hiatal hernia    Hypertension    Migraines    Mitral regurgitation    Osteoporosis    Ovarian cyst    left    Past Surgical History:  Procedure Laterality Date   BUNIONECTOMY Left    COLONOSCOPY     ESOPHAGOGASTRODUODENOSCOPY     KNEE ARTHROCENTESIS Left     Current Outpatient Medications  Medication Sig Dispense Refill   acetaminophen  (TYLENOL ) 500 MG tablet Take 1,000 mg by mouth every 6 (six) hours as needed for moderate pain or headache.  brexpiprazole (REXULTI) 1 MG TABS tablet Take 1 tablet by mouth daily.     chlorthalidone (HYGROTON) 25 MG tablet Take 25 mg by mouth daily.     clonazePAM (KLONOPIN) 1 MG tablet Take 0.5 mg by mouth 4 (four) times daily as needed.     dicyclomine  (BENTYL ) 10 MG capsule Take 1 capsule (10 mg total) by mouth 3 (three) times daily as needed for spasms. 90  capsule 0   diltiazem (TIAZAC) 240 MG 24 hr capsule Take 240 mg by mouth at bedtime.     doxazosin (CARDURA) 2 MG tablet Take 1 tablet by mouth at bedtime.     ibandronate  (BONIVA ) 150 MG tablet Take 150 mg by mouth every 30 (thirty) days. Take in the morning with a full glass of water, on an empty stomach, and do not take anything else by mouth or lie down for the next 30 min.     irbesartan (AVAPRO) 300 MG tablet Take 1 tablet by mouth at bedtime.     L-Methylfolate 15 MG TABS Take 1 tablet by mouth daily.     LAMOTRIGINE PO Take 300 mg by mouth at bedtime.     OnabotulinumtoxinA (BOTOX IJ) Inject 1 Dose as directed every 3 (three) months.     ondansetron  (ZOFRAN -ODT) 4 MG disintegrating tablet Take 1 tablet (4 mg total) by mouth every 8 (eight) hours as needed for nausea or vomiting. 20 tablet 0   potassium chloride  20 MEQ/15ML (10%) SOLN Take 10 mEq by mouth daily.     rosuvastatin (CRESTOR) 20 MG tablet Take 20 mg by mouth daily.     traZODone (DESYREL) 150 MG tablet Take 150 mg by mouth at bedtime.     vortioxetine HBr (TRINTELLIX) 20 MG TABS tablet Take 20 mg by mouth daily.     pantoprazole  (PROTONIX ) 40 MG tablet Take 1 tablet (40 mg total) by mouth 2 (two) times daily. 180 tablet 1   No current facility-administered medications for this visit.    Allergies as of 07/25/2023 - Review Complete 07/25/2023  Allergen Reaction Noted   Cefdinir Itching 08/16/2020   Norvasc [amlodipine] Other (See Comments) 08/16/2020    Family History  Problem Relation Age of Onset   Hypertension Mother    Asthma Mother    Stroke Father    Hypertension Father    Parkinson's disease Brother    Uterine cancer Maternal Grandmother        mets to lung   Hypertension Maternal Grandmother    Lung cancer Maternal Grandmother    Liver cancer Maternal Grandfather    Stomach cancer Maternal Grandfather    Diabetes Maternal Grandfather    Parkinson's disease Paternal Grandmother    Heart failure  Paternal Grandmother    Heart attack Paternal Grandfather    Pancreatic cancer Cousin    Colon cancer Cousin    Breast cancer Neg Hx     Social History   Socioeconomic History   Marital status: Single    Spouse name: Not on file   Number of children: 0   Years of education: Not on file   Highest education level: Not on file  Occupational History   Occupation: retired Runner, broadcasting/film/video  Tobacco Use   Smoking status: Never   Smokeless tobacco: Never  Vaping Use   Vaping status: Never Used  Substance and Sexual Activity   Alcohol use: No   Drug use: No   Sexual activity: Not on file  Other Topics Concern   Not on  file  Social History Narrative   Not on file   Social Drivers of Health   Financial Resource Strain: Not on file  Food Insecurity: Not on file  Transportation Needs: Not on file  Physical Activity: Not on file  Stress: Not on file  Social Connections: Not on file  Intimate Partner Violence: Not on file    Physical Exam:  Vital signs: BP 120/78   Pulse 67   Ht 5\' 4"  (1.626 m)   Wt 154 lb 12.8 oz (70.2 kg)   LMP 10/04/2014   BMI 26.57 kg/m    Constitutional:   Pleasant Caucasian female appears to be in NAD, Well developed, Well nourished, alert and cooperative.  Respiratory: Respirations even and unlabored Cardiovascular: Regular rate Gastrointestinal:  Soft, nondistended, nontender. Psychiatric: Demonstrates good judgement and reason without abnormal affect or behaviors.  RELEVANT LABS AND IMAGING: CBC    Component Value Date/Time   WBC 6.4 11/24/2022 1040   RBC 4.53 11/24/2022 1040   HGB 13.3 11/24/2022 1040   HCT 37.8 11/24/2022 1040   PLT 274.0 11/24/2022 1040   MCV 83.4 11/24/2022 1040   MCH 29.0 10/31/2022 2112   MCHC 35.3 11/24/2022 1040   RDW 12.2 11/24/2022 1040   LYMPHSABS 1.1 11/24/2022 1040   MONOABS 0.4 11/24/2022 1040   EOSABS 0.0 11/24/2022 1040   BASOSABS 0.0 11/24/2022 1040    CMP     Component Value Date/Time   NA 141  01/11/2023 1148   K 4.2 01/11/2023 1148   CL 99 01/11/2023 1148   CO2 28 01/11/2023 1148   GLUCOSE 105 (H) 01/11/2023 1148   GLUCOSE 136 (H) 10/31/2022 2112   BUN 14 01/11/2023 1148   CREATININE 0.87 01/11/2023 1148   CALCIUM 10.3 (H) 01/11/2023 1148   PROT 6.6 01/11/2023 1148   ALBUMIN 4.9 01/11/2023 1148   AST 17 01/11/2023 1148   ALT 13 01/11/2023 1148   ALKPHOS 101 01/11/2023 1148   BILITOT 0.6 01/11/2023 1148   GFRNONAA >60 10/31/2022 2112    Assessment: Epigastric abdominal pain - improved GERD - improved Constipation - improved Elevated LFTs - resolved Patient's GERD and ab pain have improved. She now is only requiring PPI therapy PRN. Her ab pain has also improved after starting treatment for constipation with Miralax  every day or QOD. Patient did mention that she has noticed some more hiccups. Sometimes PPI therapy can help with hiccups so I told the patient that if she wished, she could try to take the Protonix  every day, which may help with reducing the frequency of her hiccups. Her recent LFTs have normalized which is reassuring.  Plan: - Start Miralax  every other day to help with constipation - Could try PPI every day to help with hiccups. - Next colonoscopy due in 12/2031 for colon cancer screening - RTC 1 year  Regino Caprio, MD [facility] 07/25/2023, 10:51 AM  I spent 30 minutes of time, including in depth chart review, independent review of results as outlined above, communicating results with the patient directly, face-to-face time with the patient, coordinating care, and ordering studies and medications as appropriate, and documentation.

## 2023-07-25 NOTE — Patient Instructions (Signed)
 Glad you are feeling better!  Okay take Pantoprazole  1 time daily for hiccups  Follow up in 1 year  _______________________________________________________  If your blood pressure at your visit was 140/90 or greater, please contact your primary care physician to follow up on this.  _______________________________________________________  If you are age 57 or older, your body mass index should be between 23-30. Your Body mass index is 26.57 kg/m. If this is out of the aforementioned range listed, please consider follow up with your Primary Care Provider.  If you are age 22 or younger, your body mass index should be between 19-25. Your Body mass index is 26.57 kg/m. If this is out of the aformentioned range listed, please consider follow up with your Primary Care Provider.   ________________________________________________________  The [facility] providers would like to encourage you to use MYCHART to communicate with providers for non-urgent requests or questions.  Due to long hold times on the telephone, sending your provider a message by [facility] may be a faster and more efficient way to get a response.  Please allow 48 business hours for a response.  Please remember that this is for non-urgent requests.  _______________________________________________________  Thank you for entrusting me with your care and for choosing [facility], Dr. Regino Caprio

## 2023-07-31 ENCOUNTER — Encounter (HOSPITAL_COMMUNITY): Payer: 59

## 2023-08-08 ENCOUNTER — Encounter (HOSPITAL_COMMUNITY): Payer: 59

## 2023-08-16 ENCOUNTER — Encounter: Payer: Self-pay | Admitting: Cardiology

## 2023-08-21 ENCOUNTER — Encounter (HOSPITAL_COMMUNITY): Payer: 59

## 2023-08-21 ENCOUNTER — Inpatient Hospital Stay (HOSPITAL_COMMUNITY): Admission: RE | Admit: 2023-08-21 | Payer: 59 | Source: Ambulatory Visit

## 2023-08-23 ENCOUNTER — Encounter (HOSPITAL_COMMUNITY): Payer: Self-pay | Admitting: Obstetrics and Gynecology

## 2023-08-23 NOTE — Progress Notes (Signed)
 Spoke w/ via phone for pre-op interview---[facility] Lab needs dos----  CBC, RPR, T&S and HIV per surgeon. BMP per anesthesia.       Lab results------Current EKG in Epic dated 01/11/23. Echo dated 02/19/23 in Epic EF 69%. COVID test -----patient states asymptomatic no test needed Arrive at -------0530 NPO after MN NO Solid Food.   Pre-Surgery Ensure or G2:  Med rec completed Medications to take morning of surgery ----- Trentellix and Rexulti. Klonopin PRN Diabetic medication -----  GLP1 agonist last dose: GLP1 instructions:  Patient instructed no nail polish to be worn day of surgery Patient instructed to bring photo id and insurance card day of surgery Patient aware to have Driver (ride ) / caregiver    for 24 hours after surgery - Mother Mistina Coatney Patient Special Instructions ----- Shower with antibacterial soap. Pre-Op special Instructions -----  Patient verbalized understanding of instructions that were given at this phone interview. Patient denies chest pain, sob, fever, cough at the interview.   Cardiologist Dr Alda Amas LOV 04/02/23. F/U 6 months.

## 2023-08-27 NOTE — H&P (Signed)
 Preoperative History and Physical  Cynthia Roberts is a 58 y.o. G0 presenting for laparoscopic bilateral salpingo-oophorectomy for L ovarian dermoid cyst and postmenopausal status. 5cm L ovarian cyst noted on US  07/2023 for evaluation of dull LLQ abdominal pain - CA125 normal, appearance c/w dermoid. After discussion, patient desires BSO as she is postmenopausal.  Pertinent Gynecological History: Menses: N/A - postmenopausal Bleeding: none Contraception: none DES exposure: unknown Blood transfusions: none Sexually transmitted diseases: no past history Previous GYN Procedures: none  Last mammogram: normal Date: 03/2023 Last pap: normal Date: 06/2022 OB History: G0   Menstrual History: Patient's last menstrual period was 10/04/2014.    Past Medical History:  Diagnosis Date   Anxiety    Depression    Hiatal hernia    Hypertension    Migraines    Mitral regurgitation    Osteoporosis    Ovarian cyst    left   PONV (postoperative nausea and vomiting)     Past Surgical History:  Procedure Laterality Date   BUNIONECTOMY Left    COLONOSCOPY     ESOPHAGOGASTRODUODENOSCOPY     KNEE ARTHROCENTESIS Left     Family History  Problem Relation Age of Onset   Hypertension Mother    Asthma Mother    Stroke Father    Hypertension Father    Parkinson's disease Brother    Uterine cancer Maternal Grandmother        mets to lung   Hypertension Maternal Grandmother    Lung cancer Maternal Grandmother    Liver cancer Maternal Grandfather    Stomach cancer Maternal Grandfather    Diabetes Maternal Grandfather    Parkinson's disease Paternal Grandmother    Heart failure Paternal Grandmother    Heart attack Paternal Grandfather    Pancreatic cancer Cousin    Colon cancer Cousin    Breast cancer Neg Hx     Social History:  reports that she has never smoked. She has never used smokeless tobacco. She reports that she does not drink alcohol and does not use drugs.  Allergies:   Allergies  Allergen Reactions   Cefdinir Itching    Other reaction(s): itching   Celebrex [Celecoxib] Nausea Only and Other (See Comments)    Stomach pain   Mobic  [Meloxicam ] Nausea Only and Other (See Comments)    Stomach pain   Norvasc [Amlodipine] Other (See Comments)    Other reaction(s): headache headcaches     No medications prior to admission.      08/23/2023    2:37 PM 07/25/2023   10:21 AM 05/24/2023   10:47 AM  Vitals with BMI  Height 5\' 5"  5\' 4"    Weight 154 lbs 154 lbs 13 oz 148 lbs  BMI 25.63 26.56   Systolic  120 123  Diastolic  78 70  Pulse  67 71   Height 5\' 5"  (1.651 m), weight 69.9 kg, last menstrual period 10/04/2014.  Exam from office visit Constitutional:      Appearance: Normal appearance.  HENT:     Head: Normocephalic.  Eyes:     Pupils: Pupils are equal, round. Cardiovascular:     Rate and Rhythm: Normal rate.    Pulses: Normal pulses.  Abdominal:     General: Abdomen is nontender. Neurological:     Mental Status: She is alert.  Assessment/Plan: Cynthia Roberts is a 58 y.o. G0 presenting for laparoscopic bilateral salpingo-oophorectomy for 5cm L ovarian cyst (CA 125 normal, appearance c/w dermoid) and postmenopausal status. We discussed laparoscopic BSO  as she is postmenopausal and has a small simple-appearing cyst on her right ovary as well. She understands that any laparoscopic procedure has risk of needing to be performed open if difficulty controlling bleeding or visualizing structures. R/b/a discussed with patient. She understands risks of bleeding, infection, and injury to surrounding structures. Postoperative medications already sent to pharmacy at preoperative visit. Plan discharge from PACU.  Cynthia Roberts 08/27/2023, 9:12 AM

## 2023-09-02 NOTE — Anesthesia Preprocedure Evaluation (Signed)
 Anesthesia Evaluation  Patient identified by MRN, date of birth, ID band Patient awake    Reviewed: Allergy & Precautions, NPO status , Patient's Chart, lab work & pertinent test results  History of Anesthesia Complications (+) PONV and history of anesthetic complications  Airway Mallampati: III  TM Distance: >3 FB Neck ROM: Full    Dental  (+) Dental Advisory Given   Pulmonary neg pulmonary ROS   Pulmonary exam normal breath sounds clear to auscultation       Cardiovascular hypertension (chlorthalidone, irbesartan), Pt. on medications (-) angina (-) Past MI, (-) Cardiac Stents and (-) CABG (-) dysrhythmias + Valvular Problems/Murmurs MR  Rhythm:Regular Rate:Normal  HLD  TTE 02/19/2023: IMPRESSIONS    1. Left ventricular ejection fraction, by estimation, is 60 to 65%. The  left ventricle has normal function. The left ventricle has no regional  wall motion abnormalities. Left ventricular diastolic parameters were  normal.   2. Right ventricular systolic function is normal. The right ventricular  size is normal.   3. Left atrial size was moderately dilated.   4. The mitral valve is abnormal. Mild mitral valve regurgitation. No  evidence of mitral stenosis.   5. The aortic valve is tricuspid. Aortic valve regurgitation is not  visualized. No aortic stenosis is present.   6. The inferior vena cava is normal in size with greater than 50%  respiratory variability, suggesting right atrial pressure of 3 mmHg     Neuro/Psych  Headaches, neg Seizures PSYCHIATRIC DISORDERS Anxiety Depression       GI/Hepatic Neg liver ROS, hiatal hernia,,,  Endo/Other  negative endocrine ROS    Renal/GU negative Renal ROS     Musculoskeletal Osteoporosis    Abdominal   Peds  Hematology negative hematology ROS (+) Lab Results      Component                Value               Date                      WBC                      6.4                  11/24/2022                HGB                      13.3                11/24/2022                HCT                      37.8                11/24/2022                MCV                      83.4                11/24/2022                PLT  274.0               11/24/2022              Anesthesia Other Findings   Reproductive/Obstetrics Bilateral ovarian cyst                             Anesthesia Physical Anesthesia Plan  ASA: 2  Anesthesia Plan: General   Post-op Pain Management: Tylenol  PO (pre-op)*   Induction: Intravenous  PONV Risk Score and Plan: 4 or greater and Ondansetron , Dexamethasone, Midazolam, Scopolamine patch - Pre-op and Treatment may vary due to age or medical condition  Airway Management Planned: Oral ETT  Additional Equipment:   Intra-op Plan:   Post-operative Plan: Extubation in OR  Informed Consent: I have reviewed the patients History and Physical, chart, labs and discussed the procedure including the risks, benefits and alternatives for the proposed anesthesia with the patient or authorized representative who has indicated his/her understanding and acceptance.     Dental advisory given  Plan Discussed with: CRNA and Anesthesiologist  Anesthesia Plan Comments: (Risks of general anesthesia discussed including, but not limited to, sore throat, hoarse voice, chipped/damaged teeth, injury to vocal cords, nausea and vomiting, allergic reactions, lung infection, heart attack, stroke, and death. All questions answered. )       Anesthesia Quick Evaluation

## 2023-09-03 ENCOUNTER — Ambulatory Visit (HOSPITAL_COMMUNITY): Payer: Self-pay | Admitting: Anesthesiology

## 2023-09-03 ENCOUNTER — Encounter (HOSPITAL_COMMUNITY)
Admission: RE | Disposition: A | Discharge status: Home / Self Care | Payer: Self-pay | Source: Home / Self Care | Attending: Obstetrics and Gynecology

## 2023-09-03 ENCOUNTER — Ambulatory Visit (HOSPITAL_COMMUNITY)
Admission: RE | Admit: 2023-09-03 | Discharge: 2023-09-03 | Disposition: A | Discharge status: Home / Self Care | Attending: Obstetrics and Gynecology | Admitting: Obstetrics and Gynecology

## 2023-09-03 ENCOUNTER — Encounter (HOSPITAL_COMMUNITY): Payer: Self-pay | Admitting: Obstetrics and Gynecology

## 2023-09-03 ENCOUNTER — Other Ambulatory Visit: Payer: Self-pay

## 2023-09-03 DIAGNOSIS — Z780 Asymptomatic menopausal state: Secondary | ICD-10-CM | POA: Insufficient documentation

## 2023-09-03 DIAGNOSIS — F418 Other specified anxiety disorders: Secondary | ICD-10-CM | POA: Diagnosis not present

## 2023-09-03 DIAGNOSIS — I10 Essential (primary) hypertension: Secondary | ICD-10-CM | POA: Diagnosis not present

## 2023-09-03 DIAGNOSIS — D271 Benign neoplasm of left ovary: Secondary | ICD-10-CM | POA: Insufficient documentation

## 2023-09-03 DIAGNOSIS — K449 Diaphragmatic hernia without obstruction or gangrene: Secondary | ICD-10-CM | POA: Insufficient documentation

## 2023-09-03 DIAGNOSIS — M810 Age-related osteoporosis without current pathological fracture: Secondary | ICD-10-CM | POA: Insufficient documentation

## 2023-09-03 DIAGNOSIS — N83201 Unspecified ovarian cyst, right side: Secondary | ICD-10-CM

## 2023-09-03 DIAGNOSIS — F419 Anxiety disorder, unspecified: Secondary | ICD-10-CM | POA: Insufficient documentation

## 2023-09-03 DIAGNOSIS — N83202 Unspecified ovarian cyst, left side: Secondary | ICD-10-CM

## 2023-09-03 DIAGNOSIS — I340 Nonrheumatic mitral (valve) insufficiency: Secondary | ICD-10-CM | POA: Insufficient documentation

## 2023-09-03 DIAGNOSIS — F32A Depression, unspecified: Secondary | ICD-10-CM | POA: Diagnosis not present

## 2023-09-03 DIAGNOSIS — R519 Headache, unspecified: Secondary | ICD-10-CM | POA: Insufficient documentation

## 2023-09-03 DIAGNOSIS — D270 Benign neoplasm of right ovary: Secondary | ICD-10-CM | POA: Insufficient documentation

## 2023-09-03 HISTORY — DX: Other specified postprocedural states: R11.2

## 2023-09-03 HISTORY — PX: LAPAROSCOPIC SALPINGO OOPHERECTOMY: SHX5927

## 2023-09-03 LAB — BASIC METABOLIC PANEL WITH GFR
Anion gap: 8 (ref 5–15)
BUN: 14 mg/dL (ref 6–20)
CO2: 30 mmol/L (ref 22–32)
Calcium: 9.7 mg/dL (ref 8.9–10.3)
Chloride: 101 mmol/L (ref 98–111)
Creatinine, Ser: 0.94 mg/dL (ref 0.44–1.00)
GFR, Estimated: >60 mL/min
Glucose, Bld: 112 mg/dL — ABNORMAL HIGH (ref 70–99)
Potassium: 4 mmol/L (ref 3.5–5.1)
Sodium: 139 mmol/L (ref 135–145)

## 2023-09-03 LAB — CBC
HCT: 36.1 % (ref 36.0–46.0)
Hemoglobin: 12.6 g/dL (ref 12.0–15.0)
MCH: 29.8 pg (ref 26.0–34.0)
MCHC: 34.9 g/dL (ref 30.0–36.0)
MCV: 85.3 fL (ref 80.0–100.0)
Platelets: 201 10*3/uL (ref 150–400)
RBC: 4.23 MIL/uL (ref 3.87–5.11)
RDW: 11.9 % (ref 11.5–15.5)
WBC: 6.2 10*3/uL (ref 4.0–10.5)
nRBC: 0 % (ref 0.0–0.2)

## 2023-09-03 LAB — ABO/RH: ABO/RH(D): O POS

## 2023-09-03 LAB — TYPE AND SCREEN
ABO/RH(D): O POS
Antibody Screen: NEGATIVE

## 2023-09-03 SURGERY — SALPINGO-OOPHORECTOMY, LAPAROSCOPIC
Anesthesia: General | Site: Abdomen | Laterality: Bilateral

## 2023-09-03 MED ORDER — FENTANYL CITRATE (PF) 250 MCG/5ML IJ SOLN
INTRAMUSCULAR | Status: DC | PRN
  Administered 2023-09-03: 100 ug via INTRAVENOUS
  Administered 2023-09-03: 50 ug via INTRAVENOUS

## 2023-09-03 MED ORDER — MIDAZOLAM HCL 2 MG/2ML IJ SOLN
INTRAMUSCULAR | Status: DC | PRN
  Administered 2023-09-03: 2 mg via INTRAVENOUS

## 2023-09-03 MED ORDER — SCOPOLAMINE 1 MG/3DAYS TD PT72
MEDICATED_PATCH | TRANSDERMAL | Status: DC
Start: 2023-09-03 — End: 2023-09-03
  Filled 2023-09-03: qty 1

## 2023-09-03 MED ORDER — AMISULPRIDE (ANTIEMETIC) 5 MG/2ML IV SOLN: 10.0000 mg | Freq: Once | INTRAVENOUS | Status: DC | PRN

## 2023-09-03 MED ORDER — ONDANSETRON HCL 4 MG/2ML IJ SOLN
INTRAMUSCULAR | Status: DC | PRN
  Administered 2023-09-03: 4 mg via INTRAVENOUS

## 2023-09-03 MED ORDER — LIDOCAINE 2% (20 MG/ML) 5 ML SYRINGE
INTRAMUSCULAR | Status: DC | PRN
  Administered 2023-09-03: 100 mg via INTRAVENOUS

## 2023-09-03 MED ORDER — DEXAMETHASONE SODIUM PHOSPHATE 10 MG/ML IJ SOLN
INTRAMUSCULAR | Status: DC | PRN
  Administered 2023-09-03: 10 mg via INTRAVENOUS

## 2023-09-03 MED ORDER — POVIDONE-IODINE 10 % EX SWAB: 2.0000 | Freq: Once | CUTANEOUS | Status: DC

## 2023-09-03 MED ORDER — ACETAMINOPHEN 500 MG PO TABS
1000.0000 mg | ORAL_TABLET | ORAL | Status: AC
Start: 2023-09-03 — End: 2023-09-03
  Administered 2023-09-03: 1000 mg via ORAL

## 2023-09-03 MED ORDER — SCOPOLAMINE 1 MG/3DAYS TD PT72
1.0000 | MEDICATED_PATCH | TRANSDERMAL | Status: DC
  Administered 2023-09-03: 1.5 mg via TRANSDERMAL

## 2023-09-03 MED ORDER — SODIUM CHLORIDE 0.9 % IR SOLN
Status: DC | PRN
  Administered 2023-09-03: 1000 mL

## 2023-09-03 MED ORDER — ORAL CARE MOUTH RINSE: 15.0000 mL | Freq: Once | OROMUCOSAL | Status: AC

## 2023-09-03 MED ORDER — BUPIVACAINE HCL (PF) 0.5 % IJ SOLN
INTRAMUSCULAR | Status: DC | PRN
  Administered 2023-09-03: 15 mL

## 2023-09-03 MED ORDER — PHENYLEPHRINE 80 MCG/ML (10ML) SYRINGE FOR IV PUSH (FOR BLOOD PRESSURE SUPPORT)
PREFILLED_SYRINGE | INTRAVENOUS | Status: DC | PRN
  Administered 2023-09-03: 160 ug via INTRAVENOUS

## 2023-09-03 MED ORDER — SUGAMMADEX SODIUM 200 MG/2ML IV SOLN
INTRAVENOUS | Status: DC | PRN
  Administered 2023-09-03: 140 mg via INTRAVENOUS

## 2023-09-03 MED ORDER — FENTANYL CITRATE (PF) 250 MCG/5ML IJ SOLN
INTRAMUSCULAR | Status: AC
  Filled 2023-09-03: qty 5

## 2023-09-03 MED ORDER — PROPOFOL 10 MG/ML IV BOLUS
INTRAVENOUS | Status: DC | PRN
  Administered 2023-09-03: 150 mg via INTRAVENOUS
  Administered 2023-09-03: 50 mg via INTRAVENOUS

## 2023-09-03 MED ORDER — LACTATED RINGERS IV SOLN: INTRAVENOUS | Status: DC

## 2023-09-03 MED ORDER — OXYCODONE HCL 5 MG/5ML PO SOLN: 5.0000 mg | Freq: Once | ORAL | Status: DC | PRN

## 2023-09-03 MED ORDER — PROPOFOL 10 MG/ML IV BOLUS
INTRAVENOUS | Status: AC
  Filled 2023-09-03: qty 20

## 2023-09-03 MED ORDER — FENTANYL CITRATE (PF) 100 MCG/2ML IJ SOLN
25.0000 ug | INTRAMUSCULAR | Status: DC | PRN
  Administered 2023-09-03 (×2): 25 ug via INTRAVENOUS

## 2023-09-03 MED ORDER — FENTANYL CITRATE (PF) 100 MCG/2ML IJ SOLN
INTRAMUSCULAR | Status: AC
  Filled 2023-09-03: qty 2

## 2023-09-03 MED ORDER — BUPIVACAINE HCL (PF) 0.5 % IJ SOLN
INTRAMUSCULAR | Status: AC
  Filled 2023-09-03: qty 30

## 2023-09-03 MED ORDER — MIDAZOLAM HCL 2 MG/2ML IJ SOLN
INTRAMUSCULAR | Status: AC
Start: 2023-09-03 — End: ?
  Filled 2023-09-03: qty 2

## 2023-09-03 MED ORDER — OXYCODONE HCL 5 MG PO TABS: 5.0000 mg | ORAL_TABLET | Freq: Once | ORAL | Status: DC | PRN

## 2023-09-03 MED ORDER — ROCURONIUM BROMIDE 10 MG/ML (PF) SYRINGE
PREFILLED_SYRINGE | INTRAVENOUS | Status: DC | PRN
  Administered 2023-09-03: 60 mg via INTRAVENOUS

## 2023-09-03 MED ORDER — CHLORHEXIDINE GLUCONATE 0.12 % MT SOLN
OROMUCOSAL | Status: AC
  Filled 2023-09-03: qty 15

## 2023-09-03 MED ORDER — CHLORHEXIDINE GLUCONATE 0.12 % MT SOLN
15.0000 mL | Freq: Once | OROMUCOSAL | Status: AC
  Administered 2023-09-03: 15 mL via OROMUCOSAL

## 2023-09-03 MED ORDER — ACETAMINOPHEN 500 MG PO TABS
ORAL_TABLET | ORAL | Status: AC
  Filled 2023-09-03: qty 2

## 2023-09-03 SURGICAL SUPPLY — 37 items
APPLICATOR SURGIFLO ENDO (HEMOSTASIS) IMPLANT
BENZOIN TINCTURE PRP APPL 2/3 (GAUZE/BANDAGES/DRESSINGS) IMPLANT
CHLORAPREP W/TINT 26 (MISCELLANEOUS) ×1 IMPLANT
CLSR STERI-STRIP ANTIMIC 1/2X4 (GAUZE/BANDAGES/DRESSINGS) IMPLANT
COVER MAYO STAND STRL (DRAPES) ×1 IMPLANT
DERMABOND ADVANCED .7 DNX12 (GAUZE/BANDAGES/DRESSINGS) IMPLANT
DRAPE SURG IRRIG POUCH 19X23 (DRAPES) ×1 IMPLANT
DRSG COVADERM PLUS 2X2 (GAUZE/BANDAGES/DRESSINGS) ×3 IMPLANT
DRSG TEGADERM 2-3/8X2-3/4 SM (GAUZE/BANDAGES/DRESSINGS) ×3 IMPLANT
GAUZE 4X4 16PLY ~~LOC~~+RFID DBL (SPONGE) IMPLANT
GAUZE SPONGE 2X2 STRL 8-PLY (GAUZE/BANDAGES/DRESSINGS) ×3 IMPLANT
GLOVE BIOGEL PI IND STRL 6 (GLOVE) ×1 IMPLANT
GLOVE INDICATOR 7.5 STRL GRN (GLOVE) IMPLANT
GLOVE SS PI 5.5 STRL (GLOVE) ×1 IMPLANT
GOWN STRL REUS W/ TWL LRG LVL3 (GOWN DISPOSABLE) ×1 IMPLANT
IRRIGATION SUCT STRKRFLW 2 WTP (MISCELLANEOUS) IMPLANT
IV NS 1000ML BAXH (IV SOLUTION) IMPLANT
KIT PINK PAD W/HEAD ARE REST (MISCELLANEOUS) ×1 IMPLANT
KIT PINK PAD W/HEAD ARM REST (MISCELLANEOUS) ×1 IMPLANT
LIGASURE VESSEL 5MM BLUNT TIP (ELECTROSURGICAL) IMPLANT
NS IRRIG 1000ML POUR BTL (IV SOLUTION) IMPLANT
NS IRRIG 500ML POUR BTL (IV SOLUTION) ×1 IMPLANT
PACK LAPAROSCOPY BASIN (CUSTOM PROCEDURE TRAY) ×1 IMPLANT
PAD OB MATERNITY 11 LF (PERSONAL CARE ITEMS) ×1 IMPLANT
SCISSORS LAP 5X35 DISP (ENDOMECHANICALS) IMPLANT
SET TUBE SMOKE EVAC HIGH FLOW (TUBING) ×1 IMPLANT
SLEEVE ADV FIXATION 5X100MM (TROCAR) ×1 IMPLANT
SLEEVE SCD COMPRESS KNEE MED (STOCKING) ×1 IMPLANT
STRIP CLOSURE SKIN 1/4X4 (GAUZE/BANDAGES/DRESSINGS) ×1 IMPLANT
SURGIFLO W/THROMBIN 8M KIT (HEMOSTASIS) IMPLANT
SUT VIC AB 4-0 PS2 27 (SUTURE) ×1 IMPLANT
SYSTEM RETRIEVL 5MM INZII UNIV (BASKET) IMPLANT
TOWEL GREEN STERILE (TOWEL DISPOSABLE) ×1 IMPLANT
TRAY FOLEY W/BAG SLVR 14FR LF (SET/KITS/TRAYS/PACK) ×1 IMPLANT
TROCAR ADV FIXATION 5X100MM (TROCAR) ×1 IMPLANT
TROCAR KII 8X100ML NONTHREADED (TROCAR) ×1 IMPLANT
WARMER LAPAROSCOPE (MISCELLANEOUS) ×1 IMPLANT

## 2023-09-03 NOTE — Anesthesia Procedure Notes (Signed)
 Procedure Name: Intubation Date/Time: 09/03/2023 7:40 AM  Performed by: Rochelle Chu, CRNAPre-anesthesia Checklist: Patient identified, Emergency Drugs available, Suction available and Patient being monitored Patient Re-evaluated:Patient Re-evaluated prior to induction Oxygen Delivery Method: Circle system utilized Preoxygenation: Pre-oxygenation with 100% oxygen Induction Type: IV induction Ventilation: Mask ventilation without difficulty Laryngoscope Size: Mac and 3 Grade View: Grade I Tube type: Oral Tube size: 7.0 mm Number of attempts: 1 Airway Equipment and Method: Stylet and Oral airway Placement Confirmation: ETT inserted through vocal cords under direct vision, positive ETCO2 and breath sounds checked- equal and bilateral Secured at: 21 cm Tube secured with: Tape Dental Injury: Teeth and Oropharynx as per pre-operative assessment

## 2023-09-03 NOTE — Discharge Instructions (Signed)

## 2023-09-03 NOTE — Anesthesia Postprocedure Evaluation (Signed)
 Anesthesia Post Note  Patient: Cynthia Roberts  Procedure(s) Performed: SALPINGO-OOPHORECTOMY, LAPAROSCOPIC (Bilateral: Abdomen)     Patient location during evaluation: PACU Anesthesia Type: General Level of consciousness: awake Pain management: pain level controlled Vital Signs Assessment: post-procedure vital signs reviewed and stable Respiratory status: spontaneous breathing, nonlabored ventilation and respiratory function stable Cardiovascular status: blood pressure returned to baseline and stable Postop Assessment: no apparent nausea or vomiting Anesthetic complications: no   No notable events documented.  Last Vitals:  Vitals:   09/03/23 0945 09/03/23 1030  BP: 122/65 115/69  Pulse: 65 60  Resp: 13 14  Temp:    SpO2: 90% 94%    Last Pain:  Vitals:   09/03/23 1030  TempSrc:   PainSc: 3                  Conard Decent

## 2023-09-03 NOTE — Transfer of Care (Signed)
 Immediate Anesthesia Transfer of Care Note  Patient: Cynthia Roberts  Procedure(s) Performed: SALPINGO-OOPHORECTOMY, LAPAROSCOPIC (Bilateral: Abdomen)  Patient Location: PACU  Anesthesia Type:General  Level of Consciousness: drowsy and patient cooperative  Airway & Oxygen Therapy: Patient Spontanous Breathing  Post-op Assessment: Report given to RN, Post -op Vital signs reviewed and stable, and Patient moving all extremities X 4  Post vital signs: Reviewed and stable  Last Vitals:  See PACU flowsheet, VSS before leaving   Last Pain:  Vitals:   09/03/23 0600  TempSrc: Oral  PainSc: 0-No pain      Patients Stated Pain Goal: 4 (09/03/23 0600)  Complications: No notable events documented.

## 2023-09-03 NOTE — Interval H&P Note (Signed)
 History and Physical Interval Note:  09/03/2023 7:02 AM  Cynthia Roberts  has presented today for surgery, with the diagnosis of Bilateral ovarian cysts.  The various methods of treatment have been discussed with the patient and family. After consideration of risks, benefits and other options for treatment, the patient has consented to  Procedure(s) with comments: SALPINGO-OOPHORECTOMY, LAPAROSCOPIC (Bilateral) - POSSIBLE RIGHT SALPINGO OOPHORECTOMY as a surgical intervention.  The patient's history has been reviewed, patient examined, no change in status, stable for surgery.  I have reviewed the patient's chart and labs.  Questions were answered to the patient's satisfaction.     [name]

## 2023-09-03 NOTE — Op Note (Signed)
 PREOPERATIVE DIAGNOSES: 1. L ovarian cyst, concern for dermoid 2. Postmenopausal status  POSTOPERATIVE DIAGNOSES: Same  PROCEDURE PERFORMED: Laparoscopic bilateral salpingo-oophorectomy  SURGEON: Dr. Cathryn Cobb  ANESTHESIA: GET  ESTIMATED BLOOD LOSS: minimal  COMPLICATIONS: None  TUBES: None.  DRAINS: None  PATHOLOGY: Bilateral fallopian tubes and ovaries with cyst  FINDINGS: On exam, under anesthesia, normal appearing vulva and vagina, normal-sized anteverted uterus. Upper abdominal survey wnl. Normal-appearing bilateral fallopian tubes. 5-6cm left ovarian cyst filled with mucous. Right ovary appears normal. Uterus with normal contour.  Procedure: The patient was consented and seen preoperatively. Consent was signed and witnessed prior to procedure start with all questions answered. Antibiotics were not indicated. She was taken to the operating room, and placed under general anesthesia with pneumatic compression stockings in place on her lower extremities. The patient was then placed in a dorsal lithotomy position with Allen stirrups. The patient was prepped and draped in the normal sterile fashion. A time-out was performed with everyone in agreement.   A Foley catheter was inserted with yellow urine found to be draining appropriate. A sterile speculum was placed and a Hulka manipulator was placed. The speculum was removed.  The skin in and around the umbilicus was injected with 0.05% Bupivacaine plain. A 5mm skin incision was made at the base of the umbilicus. The 5mm Optiview trocar with rounded tip obturator was introduced to the abdomen without difficulty with careful elevation of the anterior abdominal wall. The obturator was removed. Pneumoperitoneum was initiated at high flow and pressure of 15mm Hg. The 5mm scope was then inserted to the port for inspection of the abdomen with findings noted above.  Next, two additional ports were placed, one in the RLQ and one lateral to the  umbilical port (8mm). The skin was anesthetized in a similar fashion as above. A small skin incision was made with the scalpel bilaterally. Two ports were then placed under direct visualization without difficulty.  The patient was placed in Trendelenburg. The ureter was identified at a safe distance from the operative field. The right IP ligament was ligated and transected using the Ligasure. The right utero-ovarian ligament was then ligated and transected as well and the tubal insertion at the cornua was transected. The operative site was examined and found to be hemostatic. The same procedure was carried out on the contralateral side without difficulty. The specimens were placed in an endocatch bag and removed through the port site without difficulty.  The abdomen and pelvis were thoroughly inspected. Good hemostasis was appreciated throughout. All instruments were removed from the abdomen. The pneumoperitoneum was released and correct instrument counts were confirmed. Skin incisions were closed with 4-0 Vicryl. Attention was turned to the vagina - the hulka manipulator and the foley catheter were removed. The patient tolerated the procedure well. She was taken to the recovery room in stable condition. The patient will be discharged from the PACU after all criteria are met.     Cathryn Cobb, MD

## 2023-09-04 ENCOUNTER — Encounter (HOSPITAL_COMMUNITY): Payer: Self-pay | Admitting: Obstetrics and Gynecology

## 2023-09-04 LAB — SURGICAL PATHOLOGY

## 2023-10-15 ENCOUNTER — Other Ambulatory Visit: Payer: Self-pay | Admitting: Neurology

## 2023-10-15 DIAGNOSIS — I773 Arterial fibromuscular dysplasia: Secondary | ICD-10-CM

## 2023-10-15 DIAGNOSIS — G4453 Primary thunderclap headache: Secondary | ICD-10-CM

## 2023-10-19 NOTE — Progress Notes (Signed)
 Patient arrived in clinic today for appointment with scheduled provider. Patient was called back to exam room 3 for initial intake from nursing staff and identified via name and date of birth per protocol. Accompanied by her mother.   Nursing staff assessed height and weight and obtained accurate vital signs.    In active dialogue with patient reviewed chief complaint, completed a falls risk assessment for safety needs, reviewed allergies, did a complete medication review including updating the preferred pharmacy with the location and contact numbers.  The patient's pain was assessed with the corresponding pain scale noted in the pain flowsheet.  All questions and concerns addressed at this time regarding the patient's visit thus far.  Refill needs and personal or clinic paperwork was discussed.     Nursing staff provided the patient with their appropriate paperwork per providers request to complete for pending visit.   Patient is not at risk for falls after being assessed.   Reviewed with patient the orders, patient allergies and any history of difficulties with lab draws in the past. The patient denies issues in the past. The patient labs were drawn per provider orders.  This staff member verified that all labs ordered were collected. Venipuncture site LAC with attempts x 1.   Bandaid and 2 x 2 gauze dressing applied to site. Patient tolerated well. Blood vials labeled with patient present.   Labs packaged per protocol and placed in lab bin for courier pickup. Gajema slip created and placed in bag with samples. Copy of Gajema slip placed in lab folder for reference.  This clinical staff member sent 2 blood specimen tubes for processing: 1 # Light Green PST, 1 # Lavender top

## 2023-10-19 NOTE — Progress Notes (Signed)
 [facility]   Chief Complaint  Patient presents with  . Migraine   ID: Cynthia Roberts returns to the clinic for follow-up of her headaches.   Interval events: Cynthia Roberts is a 58 year old female with chronic migraines who presents for follow-up after Botox treatment and to discuss new medication options. She is accompanied by her mother, Mallie.  She recently received Botox treatment for her chronic migraines and feels that it has helped. She reports 3 attacks the past week. She has not yet administered her first dose of Ajovy due to questions about potential interactions with her other medications. She plans to start Ajovy soon.  Her headache attacks require her to lie on the couch with ice packs on her head. She has previously used Zazeprat, Compazine, and Benadryl without significant relief. She occasionally takes Tylenol  for headaches, being cautious of rebound headaches, and finds it somewhat effective. She has tried Imitrex, Maxalt, and Relpax in the past without success. Migranol was avoided due to concerns about heart problems, although she has no history of coronary artery disease.  She has not tried lidocaine  nasal application for migraines. She has not used baclofen for headaches.  She reports a new symptom of quivering in her left forearm, which started about a month ago. It occurs at rest, particularly when she is lying in bed or watching TV, and is not present when she is using her arm. The quivering is described as 'pretty constant' when it occurs, but she does not notice it during activities.  She had her ovaries and fallopian tubes removed due to a large cyst, which she notes as a significant recent health event. She has experienced stomach discomfort with NSAIDs like diclofenac and Celebrex, which cause nausea.  Interval events from last visit: Since last visit, she reports she 24-26 days of severe headache. This averages about 8 per  month. This is atypical for her. She does report some of the head pain episodes creep up on her and others are severe/throbbing at onset. She reports the botox helps her with her left side headaches. However, this headache is new and both sides. They are not responsive to any of the prns(including zaveprat, compazine) and also not responsive to toradol. She currently has a mild headache.   History of Present Illness:  At her initial consultation visit with Dr. Darol on September 07, 2020, she was prescribed Emgality with a loading dose and then followed by 100 mg monthly injection for migraine prevention, Zomig 5 mg nasal spray as needed for abortive therapy.  She received 2 rounds of treatments, and has not noticed benefit yet.  Her headaches are still on a daily basis.  They are still involving the left side head and mainly occipital regions.  Though she denies chronic neck pain.  Headaches are constant daily and associated with photophobia, nausea, vomiting and the dizziness.  She does have dizzy spells when headaches got worse.  She denies any episode of diplopia or vision loss.  For abortive therapy, she failed most triptans and Ubrelvy.  She has not tried Nurtec ODT.  She tried Zomig nasal spray as needed and it calms down the level and did not make headache go away.  Though she denies any chest pain or palpitation associated with triptans. She continues taking trazodone 150 mg at bedtime and temazepam 30 mg and pain on as needed for insomnia that is under control.  She also takes Trintellix 20 mg daily, lamotrigine 300  mg daily, Rexulti per the psychiatrist, and Cardizem 240 mg CD daily, irbesartan 300 mg daily and chlorthalidone 25 mg per the cardiologist.  Her blood pressure is well controlled. She denies any chest pain, palpitation. Side effects with current medications:  Headache Characteristics: Onset: Early 20's.  Frequency: Daily over 3 months Location: Forehead, top of the head, behind eyes,  most the left side. Quality: Throbbing, pressing, stabbing, aching Duration: Constant daily. Intensity: 3-6 /10  Migrainous features: Has photophobia, nausea, vomiting, dizziness. Aura: No History of brain injury: No Family History of Migraine: Father. Triggers: Heat, storms, alcohol, chocolate, coffee, stress, lack of sleep, bright lights. Autonomic symptoms: No Postural: Feel better when lying down. Sleep: 8-9 hrs nightly.   Treatment:  Prophylactic: Nortriptyline, Verapamil, Topamax, Effexor, gabapentin, trazodone, Lamictal, temazepam, irbesartan, diltiazem, Trintellix. Zonisamide. Emgality. Abortive: OTC, Zomig, Amerge, Imitrex, Maxalt, Relpax, DHE, Frova, Compazine, Ubrelvy. Eletriptan. Cannot do NSAIDs due to nausea, Supplements:  Injections: Trigger point injection. Anti-CGRP's: [place] Silence, zavzeprat, Nurtec Botox: No.  Lab Results: With a local PCP routinely.   Brain MRA without contrast on Aug 09, 2016: IMPRESSION:  Moderate luminal irregularity included cervical internal carotid  arteries compatible with history of fibromuscular dysplasia.   Brain CT:  Allergies:  Allergies  Allergen Reactions  . Amlodipine Headache    Other reaction(s): headache  headcaches  headcaches  . Cefdinir Itching    Other reaction(s): itching  . Meloxicam  Abdominal Pain    Nausea and vomiting    All Current Medications: Current Outpatient Medications  Medication Sig Dispense Refill  . brexpiprazole (REXULTI) 1 mg Tab Take by mouth once daily    . chlorthalidone 25 MG tablet Take 25 mg by mouth once daily    . clonazePAM (KLONOPIN) 1 MG tablet Take 1 mg by mouth 3 (three) times daily as needed    . diltiazem (CARDIZEM CD) 240 MG CD capsule Take 240 mg by mouth once daily    . doxazosin (CARDURA) 2 MG tablet Take 2 mg by mouth nightly    . esketamine (SPRAVATO) 84 mg (28 mg x 3) [place] 42 mg into one nostril every 14 (fourteen) days    . ibandronate  (BONIVA ) 3 mg/3  mL injection Inject 3 mg into the vein every 3 (three) months    . irbesartan (AVAPRO) 300 MG tablet Take 300 mg by mouth once daily    . lamoTRIgine (LAMICTAL) 200 MG tablet Take 300 mg by mouth once daily Takes 300mg  daily    . levomefolate calcium 15 mg Tab Take by mouth once daily    . ondansetron  (ZOFRAN -ODT) 4 MG disintegrating tablet Take 1 tablet (4 mg total) by mouth every 8 (eight) hours as needed 20 tablet 4  . pantoprazole  (PROTONIX ) 40 MG DR tablet Take 2 tablets by mouth once daily    . rosuvastatin (CRESTOR) 20 MG tablet Take 20 mg by mouth once daily    . traZODone (DESYREL) 150 MG tablet Take 150 mg by mouth nightly    . vortioxetine (TRINTELLIX) 20 mg tablet Take 20 mg by mouth once daily    . baclofen (LIORESAL) 5 mg tablet Take 1/2-1 tablet nightly during headache flare and can take additional 1/2-1 tablet in the morning for breakthrough headache if needed 30 tablet 0  . fremanezumab-vfrm (AJOVY AUTOINJECTOR) 225 mg/1.5 mL AtIn Inject 225 mg subcutaneously every 28 (twenty-eight) days (Patient not taking: Reported on 10/19/2023) 1.5 mL 11  . lidocaine  (XYLOCAINE ) 2 % solution Draw up about 1.0 ml of  viscous lidocaine  into a 1 ml syringe. Administer to either nare as needed for headache attack. Limit use to 4 times per day. 100 mL 3  . syringe, disposable, 1 mL syringe Use as directed (to use with lidocaine (no needle)) for up to 30 days 50 each 0  . zavegepant 10 mg/actuation [place] 10 mg into one nostril as directed (Administer 10 mg as a single spray in one nostril, as  needed. The maximum dose in a 24-hour period is 10 mg (one spray).) (Patient not taking: Reported on 10/19/2023) 6 each 5   No current facility-administered medications for this visit.    Review of Systems: Reviewed   Physical and Neurological Examination:  BP 126/78 (BP Location: Left upper arm, Patient Position: Sitting, BP Cuff Size: Adult)   Pulse 66   Ht 165.1 cm (5' 5)   Wt 68 kg (150 lb)   BMI  24.96 kg/m  Patient is well nourished and developed, in no acute distress. Slight mask like face. CN: PERLA, wears glasses, normal range of movement of eyes, face symmetric, strong eyelid closure, midline tongue Motor: 5/5 throughout. No drift. Normal upper extremity tone.  Coordination: Norma rate and rhythm of rapid alternating movements of hands, including finger tapping and rapid alternating movement. Sensory: LT symmetric equal throughout Reflexes: Brisk 2+ in UE. Brachioradialis with finger flexors. No hoffmans. Patella 3+ with cross abduction. 2+ in achilles. Downgoing toes. No clonus. Gait: Normal gait. No tremor was noted. Moves both arms less briskly than expected. Normal turn.   Gait: Narrow based, normal  Impression: 1.  Status migrainosus, Chronic migraine headaches, refractory to numerous preventive medications but improvement since last botox 2.  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  Continue botox 3.  Continue  headache diary to assess duration of headache and number of total headache days 4.  Will start Ajovy monthly 5.  Follow-up MRI brain wwo contrast and MRA head and neck due to thunderclap nature of headaches and FMD history(has been scheduled) 6.  Start lidocaine  home SPG block and also baclofen prn for As needed headache treatment. Of note, triptans are still okay but she reports not effective. We may re-try holland if above is not effective. 7.  We are unable to do prednisone due to osteoporosis history. 8.  Continue to follow stroke/vascular specialist regarding FMD 9. Will videotape her left forearm muscle twich  Attestation Statement:   I personally performed the service. (TP)  SWETA SENGUPTA, MD  30 minutes of total time was spent face to face with the patient and greater than 50% was spent in counseling and/or coordination of care of the patient's problems as documented.

## 2023-10-23 ENCOUNTER — Other Ambulatory Visit

## 2023-10-23 ENCOUNTER — Ambulatory Visit
Admission: RE | Admit: 2023-10-23 | Discharge: 2023-10-23 | Disposition: A | Discharge status: Home / Self Care | Source: Ambulatory Visit | Attending: Neurology | Admitting: Neurology

## 2023-10-23 ENCOUNTER — Ambulatory Visit
Admission: RE | Admit: 2023-10-23 | Discharge: 2023-10-23 | Disposition: A | Discharge status: Home / Self Care | Source: Ambulatory Visit | Attending: Neurology

## 2023-10-23 DIAGNOSIS — I773 Arterial fibromuscular dysplasia: Secondary | ICD-10-CM

## 2023-10-23 DIAGNOSIS — G4453 Primary thunderclap headache: Secondary | ICD-10-CM

## 2023-10-23 MED ORDER — GADOPICLENOL 0.5 MMOL/ML IV SOLN
7.0000 mL | Freq: Once | INTRAVENOUS | Status: AC | PRN
  Administered 2023-10-23: 7 mL via INTRAVENOUS

## 2023-11-05 ENCOUNTER — Other Ambulatory Visit: Payer: Self-pay | Admitting: Otolaryngology

## 2023-11-12 ENCOUNTER — Inpatient Hospital Stay (HOSPITAL_COMMUNITY): Admission: RE | Admit: 2023-11-12 | Source: Ambulatory Visit

## 2023-11-15 ENCOUNTER — Encounter (HOSPITAL_COMMUNITY): Payer: Self-pay | Admitting: Otolaryngology

## 2023-11-15 ENCOUNTER — Other Ambulatory Visit: Payer: Self-pay

## 2023-11-15 NOTE — Progress Notes (Signed)
 PCP - Shayne Anes, MD  Cardiologist - Kate Lonni CROME, MD LOV 04-02-23   PPM/ICD - denies Device Orders - n/a Rep Notified - n/a  Chest x-ray - denies EKG - 01-11-23 Stress Test - 01-14-20 ECHO - 02-19-23 Cardiac Cath - denies  CPAP - denies  Dm denies  Blood Thinner Instructions: denies Aspirin Instructions: denies  ERAS Protcol - clear liquids until 8:30  am.  COVID TEST- n/a  Anesthesia review: no  Patient verbally denies any shortness of breath, fever, cough and chest pain during phone call   -------------  SDW INSTRUCTIONS given:  Your procedure is scheduled on November 16, 2023.  Report to [facility] Main Entrance A at 9:00 A.M., and check in at the Admitting office.  Call this number if you have problems the morning of surgery:  [phone]   Remember:  Do not eat after midnight the night before your surgery  You may drink clear liquids until 8:30 the morning of your surgery.   Clear liquids allowed are: Water, Non-Citrus Juices (without pulp), Carbonated Beverages, Clear Tea, Black Coffee Only, and Gatorade    Take these medicines the morning of surgery with A SIP OF WATER  acetaminophen  (TYLENOL )  Baclofen  brexpiprazole (REXULTI)  dicyclomine  (BENTYL )  lamoTRIgine (LAMICTAL)  ondansetron  (ZOFRAN -ODT)  pantoprazole  (PROTONIX )  vortioxetine HBr (TRINTELLIX)   As of today, STOP taking any Aspirin (unless otherwise instructed by your surgeon) Aleve , Naproxen , Ibuprofen, Motrin, Advil, Goody's, BC's, all herbal medications, fish oil, and all vitamins.                      Do not wear jewelry, make up, or nail polish            Do not wear lotions, powders, perfumes/colognes, or deodorant.            Do not shave 48 hours prior to surgery.  Men may shave face and neck.            Do not bring valuables to the hospital.            [facility] is not responsible for any belongings or valuables.  Do NOT Smoke (Tobacco/Vaping) 24 hours prior to  your procedure If you use a CPAP at night, you may bring all equipment for your overnight stay.   Contacts, glasses, dentures or bridgework may not be worn into surgery.      For patients admitted to the hospital, discharge time will be determined by your treatment team.   Patients discharged the day of surgery will not be allowed to drive home, and someone needs to stay with them for 24 hours.    Special instructions:   [facility]- Preparing For Surgery  Before surgery, you can play an important role. Because skin is not sterile, your skin needs to be as free of germs as possible. You can reduce the number of germs on your skin by washing with CHG (chlorahexidine gluconate) Soap before surgery.  CHG is an antiseptic cleaner which kills germs and bonds with the skin to continue killing germs even after washing.    Oral Hygiene is also important to reduce your risk of infection.  Remember - BRUSH YOUR TEETH THE MORNING OF SURGERY WITH YOUR REGULAR TOOTHPASTE  Please do not use if you have an allergy to CHG or antibacterial soaps. If your skin becomes reddened/irritated stop using the CHG.  Do not shave (including legs and underarms) for at least 48  hours prior to first CHG shower. It is OK to shave your face.  Please follow these instructions carefully.   Shower the NIGHT BEFORE SURGERY and the MORNING OF SURGERY with DIAL Soap.   Pat yourself dry with a CLEAN TOWEL.  Wear CLEAN PAJAMAS to bed the night before surgery  Place CLEAN SHEETS on your bed the night of your first shower and DO NOT SLEEP WITH PETS.   Day of Surgery: Please shower morning of surgery  Wear Clean/Comfortable clothing the morning of surgery Do not apply any deodorants/lotions.   Remember to brush your teeth WITH YOUR REGULAR TOOTHPASTE.   Questions were answered. Patient verbalized understanding of instructions.

## 2023-11-16 ENCOUNTER — Ambulatory Visit (HOSPITAL_COMMUNITY)
Admission: RE | Admit: 2023-11-16 | Discharge: 2023-11-16 | Disposition: A | Discharge status: Home / Self Care | Attending: Otolaryngology | Admitting: Otolaryngology

## 2023-11-16 ENCOUNTER — Ambulatory Visit (HOSPITAL_COMMUNITY): Admitting: Anesthesiology

## 2023-11-16 ENCOUNTER — Encounter (HOSPITAL_COMMUNITY): Payer: Self-pay | Admitting: Otolaryngology

## 2023-11-16 ENCOUNTER — Encounter (HOSPITAL_COMMUNITY)
Admission: RE | Disposition: A | Discharge status: Home / Self Care | Payer: Self-pay | Source: Home / Self Care | Attending: Otolaryngology

## 2023-11-16 DIAGNOSIS — G43E11 Chronic migraine with aura, intractable, with status migrainosus: Secondary | ICD-10-CM | POA: Diagnosis not present

## 2023-11-16 DIAGNOSIS — J3489 Other specified disorders of nose and nasal sinuses: Secondary | ICD-10-CM | POA: Insufficient documentation

## 2023-11-16 DIAGNOSIS — I10 Essential (primary) hypertension: Secondary | ICD-10-CM | POA: Insufficient documentation

## 2023-11-16 HISTORY — PX: NASAL SINUS SURGERY: SHX719

## 2023-11-16 HISTORY — DX: Unspecified osteoarthritis, unspecified site: M19.90

## 2023-11-16 SURGERY — SINUS SURGERY, ENDOSCOPIC
Anesthesia: General | Laterality: Bilateral

## 2023-11-16 MED ORDER — PROPOFOL 500 MG/50ML IV EMUL
INTRAVENOUS | Status: DC | PRN
  Administered 2023-11-16: 150 ug/kg/min via INTRAVENOUS

## 2023-11-16 MED ORDER — OXYCODONE HCL 5 MG PO TABS
5.0000 mg | ORAL_TABLET | Freq: Once | ORAL | Status: AC | PRN
  Administered 2023-11-16: 5 mg via ORAL

## 2023-11-16 MED ORDER — SCOPOLAMINE 1 MG/3DAYS TD PT72SCOPOLAMINE 1 MG/3DAYS
MEDICATED_PATCH | TRANSDERMAL | Status: DC | PRN
Start: 2023-11-16 — End: 2023-11-16
  Administered 2023-11-16: 1 via TRANSDERMAL

## 2023-11-16 MED ORDER — FENTANYL CITRATE (PF) 100 MCG/2ML IJ SOLN: 25.0000 ug | INTRAMUSCULAR | Status: DC | PRN

## 2023-11-16 MED ORDER — LACTATED RINGERS IV SOLN: INTRAVENOUS | Status: DC

## 2023-11-16 MED ORDER — ONDANSETRON HCL 4 MG/2ML IJ SOLN: 4.0000 mg | Freq: Four times a day (QID) | INTRAMUSCULAR | Status: DC | PRN

## 2023-11-16 MED ORDER — CHLORHEXIDINE GLUCONATE 0.12 % MT SOLN
OROMUCOSAL | Status: AC
  Administered 2023-11-16: 15 mL via OROMUCOSAL
  Filled 2023-11-16: qty 15

## 2023-11-16 MED ORDER — OXYCODONE HCL 5 MG PO TABS
ORAL_TABLET | ORAL | Status: AC
  Filled 2023-11-16: qty 1

## 2023-11-16 MED ORDER — CHLORHEXIDINE GLUCONATE 0.12 % MT SOLN: 15.0000 mL | Freq: Once | OROMUCOSAL | Status: AC

## 2023-11-16 MED ORDER — MUPIROCIN 2 % EX OINT
TOPICAL_OINTMENT | CUTANEOUS | Status: DC | PRN
  Administered 2023-11-16: 1 via NASAL

## 2023-11-16 MED ORDER — MIDAZOLAM HCL 2 MG/2ML IJ SOLN
INTRAMUSCULAR | Status: DC | PRN
  Administered 2023-11-16: 2 mg via INTRAVENOUS

## 2023-11-16 MED ORDER — ONDANSETRON HCL 4 MG/2ML IJ SOLN
INTRAMUSCULAR | Status: DC | PRN
  Administered 2023-11-16: 4 mg via INTRAVENOUS

## 2023-11-16 MED ORDER — MUPIROCIN 2 % EX OINT
TOPICAL_OINTMENT | CUTANEOUS | Status: AC
  Filled 2023-11-16: qty 22

## 2023-11-16 MED ORDER — PROPOFOL 10 MG/ML IV BOLUS
INTRAVENOUS | Status: DC | PRN
  Administered 2023-11-16: 200 mg via INTRAVENOUS

## 2023-11-16 MED ORDER — FLUORESCEIN SODIUM 1 MG OP STRP
ORAL_STRIP | OPHTHALMIC | Status: AC
  Filled 2023-11-16: qty 1

## 2023-11-16 MED ORDER — EPINEPHRINE HCL (NASAL) 0.1 % NA SOLN
NASAL | Status: DC | PRN
  Administered 2023-11-16: 20 mL via NASAL

## 2023-11-16 MED ORDER — SUGAMMADEX SODIUM 200 MG/2ML IV SOLN
INTRAVENOUS | Status: DC | PRN
  Administered 2023-11-16: 400 mg via INTRAVENOUS

## 2023-11-16 MED ORDER — LIDOCAINE 2% (20 MG/ML) 5 ML SYRINGE
INTRAMUSCULAR | Status: DC | PRN
  Administered 2023-11-16: 100 mg via INTRAVENOUS

## 2023-11-16 MED ORDER — FENTANYL CITRATE (PF) 250 MCG/5ML IJ SOLN
INTRAMUSCULAR | Status: AC
Start: 2023-11-16 — End: 2023-11-16
  Filled 2023-11-16: qty 5

## 2023-11-16 MED ORDER — FENTANYL CITRATE (PF) 250 MCG/5ML IJ SOLN
INTRAMUSCULAR | Status: DC | PRN
  Administered 2023-11-16: 50 ug via INTRAVENOUS
  Administered 2023-11-16: 100 ug via INTRAVENOUS
  Administered 2023-11-16 (×2): 50 ug via INTRAVENOUS

## 2023-11-16 MED ORDER — FLUORESCEIN SODIUM 1 MG OP STRP
ORAL_STRIP | OPHTHALMIC | Status: DC | PRN
  Administered 2023-11-16: 1

## 2023-11-16 MED ORDER — OXYCODONE HCL 5 MG/5ML PO SOLN: 5.0000 mg | Freq: Once | ORAL | Status: AC | PRN

## 2023-11-16 MED ORDER — ORAL CARE MOUTH RINSE: 15.0000 mL | Freq: Once | OROMUCOSAL | Status: AC

## 2023-11-16 MED ORDER — ROCURONIUM BROMIDE 10 MG/ML (PF) SYRINGE
PREFILLED_SYRINGE | INTRAVENOUS | Status: DC | PRN
  Administered 2023-11-16: 50 mg via INTRAVENOUS

## 2023-11-16 MED ORDER — DEXMEDETOMIDINE HCL IN NACL 80 MCG/20ML IV SOLN
INTRAVENOUS | Status: DC | PRN
Start: 2023-11-16 — End: 2023-11-16
  Administered 2023-11-16: 8 ug via INTRAVENOUS

## 2023-11-16 MED ORDER — EPINEPHRINE HCL (NASAL) 0.1 % NA SOLN
NASAL | Status: AC
  Filled 2023-11-16: qty 30

## 2023-11-16 MED ORDER — DEXAMETHASONE SODIUM PHOSPHATE 10 MG/ML IJ SOLN
INTRAMUSCULAR | Status: DC | PRN
  Administered 2023-11-16: 10 mg via INTRAVENOUS

## 2023-11-16 MED ORDER — LACTATED RINGERS IV SOLN: INTRAVENOUS | Status: DC | PRN

## 2023-11-16 SURGICAL SUPPLY — 55 items
ATTRACTOMAT 16X20 MAGNETIC DRP (DRAPES) IMPLANT
BAG COUNTER SPONGE SURGICOUNT (BAG) ×1 IMPLANT
BLADE NAVIG QUADCUT 4.3X13 M4 (BLADE) ×1 IMPLANT
BLADE RAD60 ROTATE M4 4 5PK (BLADE) IMPLANT
BLADE ROTATE RAD 40 4 M4 (BLADE) IMPLANT
BLADE ROTATE TRICUT 4X13 M4 (BLADE) IMPLANT
BLADE SURG 15 STRL LF DISP TIS (BLADE) IMPLANT
BUR TAPER CHOANAL ATRESIA 30K (BURR) IMPLANT
CANISTER SUCTION 3000ML PPV (SUCTIONS) ×2 IMPLANT
COAGULATOR SUCT 8FR VV (MISCELLANEOUS) ×1 IMPLANT
DRAPE HALF SHEET 40X57 (DRAPES) IMPLANT
DRESSING NASAL KENNEDY 3.5X.9 (MISCELLANEOUS) IMPLANT
ELECT COATED BLADE 2.86 ST (ELECTRODE) IMPLANT
ELECT NDL BLADE 2-5/6 (NEEDLE) IMPLANT
ELECT NEEDLE BLADE 2-5/6 (NEEDLE) IMPLANT
ELECTRODE REM PT RTRN 9FT ADLT (ELECTROSURGICAL) ×1 IMPLANT
FILTER ARTHROSCOPY CONVERTOR (FILTER) ×2 IMPLANT
GLOVE BIO SURGEON STRL SZ7.5 (GLOVE) ×1 IMPLANT
GLOVE BIOGEL PI IND STRL 8 (GLOVE) ×1 IMPLANT
GOWN STRL REUS W/ TWL LRG LVL3 (GOWN DISPOSABLE) ×1 IMPLANT
GOWN STRL REUS W/ TWL XL LVL3 (GOWN DISPOSABLE) ×1 IMPLANT
IV NS 1000ML BAXH (IV SOLUTION) ×2 IMPLANT
KIT BASIN OR (CUSTOM PROCEDURE TRAY) ×1 IMPLANT
KIT TURNOVER KIT B (KITS) ×1 IMPLANT
NDL PRECISIONGLIDE 27X1.5 (NEEDLE) IMPLANT
NDL SPNL 25GX3.5 QUINCKE BL (NEEDLE) ×1 IMPLANT
NEEDLE PRECISIONGLIDE 27X1.5 (NEEDLE) IMPLANT
NEEDLE SPNL 25GX3.5 QUINCKE BL (NEEDLE) ×1 IMPLANT
NS IRRIG 1000ML POUR BTL (IV SOLUTION) ×1 IMPLANT
PAD ARMBOARD POSITIONER FOAM (MISCELLANEOUS) ×2 IMPLANT
PATTIES SURGICAL .5 X3 (DISPOSABLE) ×1 IMPLANT
PENCIL SMOKE EVACUATOR (MISCELLANEOUS) IMPLANT
SHEATH ENDOSCRUB 0 DEG (SHEATH) ×1 IMPLANT
SHEATH ENDOSCRUB 30 DEG (SHEATH) IMPLANT
SOLUTION ANTFG W/FOAM PAD STRL (MISCELLANEOUS) ×1 IMPLANT
SPECIMEN JAR SMALL (MISCELLANEOUS) IMPLANT
SPLINT NASAL DOYLE BI-VL (GAUZE/BANDAGES/DRESSINGS) IMPLANT
SPLINT NASAL POSISEP X .6X2 (GAUZE/BANDAGES/DRESSINGS) IMPLANT
SPLINT NASAL POSISEP X2 .8X2.3 (GAUZE/BANDAGES/DRESSINGS) IMPLANT
SUT CHROMIC 3 0 SH 27 (SUTURE) IMPLANT
SUT CHROMIC 4 0 P 3 18 (SUTURE) IMPLANT
SUT CHROMIC 4 0 SH 27 (SUTURE) IMPLANT
SUT CHROMIC 5 0 P 3 (SUTURE) IMPLANT
SUT ETHILON 3 0 FSL (SUTURE) IMPLANT
SWAB COLLECTION DEVICE MRSA (MISCELLANEOUS) IMPLANT
SWAB CULTURE ESWAB REG 1ML (MISCELLANEOUS) IMPLANT
SYR 3ML LL SCALE MARK (SYRINGE) ×1 IMPLANT
SYR 50ML LL SCALE MARK (SYRINGE) ×1 IMPLANT
SYR CONTROL 10ML LL (SYRINGE) IMPLANT
TOWEL GREEN STERILE FF (TOWEL DISPOSABLE) ×1 IMPLANT
TRACKER ENT INSTRUMENT (MISCELLANEOUS) ×1 IMPLANT
TRACKER ENT PATIENT (MISCELLANEOUS) ×1 IMPLANT
TRAY ENT MC OR (CUSTOM PROCEDURE TRAY) ×1 IMPLANT
TUBE CONNECTING 12X1/4 (SUCTIONS) ×1 IMPLANT
TUBING STRAIGHTSHOT EPS 5PK (TUBING) ×1 IMPLANT

## 2023-11-16 NOTE — Op Note (Signed)
 OPERATIVE NOTE  Cynthia Roberts Date/Time of Admission: 11/16/2023  8:43 AM  CSN: [id number];FMW:[id number] Attending Provider: Luciano Standing, MD Room/Bed: MCPO/NONE DOB: 1965/05/16 Age: 58 y.o.   Pre-Op Diagnosis: Nasal cavity mass; Chronic migraine with aura, intractable, with status migrainosus  Post-Op Diagnosis: Nasal cavity mass; Chronic migraine with aura, intractable, with status migrainosus  Procedure: Rigid nasal endoscopy with excisional biopsy right nasal cavity mass ([id number])  Anesthesia: General  Surgeon(s): Standing KANDICE Luciano, MD  Staff: Circulator: Gerome Verla SAILOR, RN Relief Scrub: Mercer Lilyan FALCON, CST Scrub Person: Miriam Shields E  Implants: * No implants in log *  Specimens: * No specimens in log *  Complications: none  EBL: minimal ML  IVF: Per anesthesia ML  Condition: stable  Operative Findings:  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 General anesthesia was induced and the patient was endotracheally intubated without complication. A surgical pause was then performed to identify the correct patient, procedure, and location. After all were in agreement, we proceeded.  The patient was prepped and draped in the standard clean fashion for endoscopic sinus surgery. The nose was decongested with 1:1000 topical epinephrine .  All cottonoids were removed and set aside. The was attached to the video monitor for endoscopic viewing as needed throughout the procedure, and our attention was directed to the right nasal cavity with the findings as noted above. Thru cutting instrumentation was used to perform a gross total resection /excisional biopsy of the nasal cavity mass incising normal middle turbinate mucosal above the mass. The  specimen was fixed in formalin and sent for permanent pathology. The posterior middle turbinate was gently cauterized.    Chitosan dressings were placed. A mustache dressing was then applied. The patient was then returned to the anesthetist, who awakened and extubated the patient without incident. The patient tolerated the procedure well without complications and was transferred to the recovery room in satisfactory condition.  Plan: -F/u surgical path - Saline sprays   Standing KANDICE Luciano, MD [facility]  11/16/2023

## 2023-11-16 NOTE — Discharge Instructions (Signed)
 Endoscopic Sinus Surgery: Postoperative Care Instructions  Your active participation in the postoperative phase of treatment is critical to the success of your surgery. Please read and follow the instructions below.  Bleeding: It is normal to experience some nasal bleeding the afternoon/evening of surgery. If bleeding occurs, lean forward slightly and gently pinch the bottom 2/3 of your nose between your thumb and forefinger. Hold this for approximately 10 minutes. If you are still experiencing bleeding, you may apply Afrin (other options include neo-synephrine, 4 way nasal spray, etc) to your nose to shrink the blood vessels. Spray 3 puffs to the affected nostril(s) and then gently pinch the nose for 10 minutes. If this does not relieve the bleeding or if the bleeding is more significant, please call the hospital operator and ask for the ENT doctor on call. If extensive bleeding occurs, please call 911 or be seen at your closest emergency department. Medications:  Pain medication: The only mediation you will need to take the night of surgery is pain medication. If you wish a non-narcotic alternative, extra-strength Tylenol  and ibuprofen are often sufficient.  Antibiotics/oral steroids: If you are prescribed either antibiotic or oral steroids, start these the day after surgery.   Nasal steroid spray: Start (or restart) your nasal steroid spray one week after surgery.   Aspirin: DO NOT take aspirin for at least three days after the surgery. Saline:  Moisture is critical to proper healing. You will need to moisturize and rinse your nose for at least 6 weeks after surgery. Nasal Mist: The day after surgery, start spraying your nose with saline mist spray. Apply 2-3 sprays to each nostril every one to two hours throughout the day. Common brands include: Ayr, Ocean, or Simply Saline. They are available over-the-counter at most pharmacies.   Post-operative care: Do not blow your nose for the first week  after surgery. You may sniff back. If you need to sneeze, do not suppress it but instead sneeze with your mouth open. After 1 week, you may blow your nose gently. No strenuous activity for one week after surgery. No straining or lifting more than 15 lbs.  You should not bend over at the waist to pick things up. Instead bend at the knees, with your head up. Light walking and normal household activities are acceptable immediately after surgery. You may resume exercise at 50% intensity after one week, and full intensity at two weeks. You may drive the day after surgery if you are not requiring narcotic pain medication. If you are taking antibiotics and experience stomach upset, active culture yogurt (Nancy's yogurt) or lactobacillus tablets (available at health food stores) on a daily basis may help.  If you develop diarrhea, stop your antibiotics and contact our office.  Persistent diarrhea may require further medical evaluation. You may eat a regular diet. You should plan on taking one week off from work and ideally have a half-day planned for your first day back. Do not fly without your doctor's clearance for 7 days after surgery. Post-operative visits: You will have frequent return visits to our office after surgery.  At each visit, a procedure called nasal endoscopy will be performed. The sinus cavities may be cleaned (termed debridement) in order to assure appropriate healing of the sinuses. Visits typically occur 1, 3, and 6 weeks after surgery. We recommend taking a dose of pain medication about 45 minutes to one hour before your first postoperative visit (as long as someone can accompany you to your visit).  Post-operative visits  are usually scheduled at the time of surgery scheduling, however if you have any questions about your post-operative visit you can contact our clinic at [phone]. Call our office if you experience any of the following: Fever higher than 101?F Clear, watery nasal  drainage Any visual changes or marked swelling of the eyes Severe headache or neck stiffness Severe diarrhea Brisk bleeding  If you need to speak to a doctor after hours, please call the [facility] at [phone] and ask for the ENT doctor on call, or call 911 for emergencies.

## 2023-11-16 NOTE — Anesthesia Procedure Notes (Signed)
 Procedure Name: Intubation Date/Time: 11/16/2023 12:13 PM  Performed by: Scherrie Mast, CRNAPre-anesthesia Checklist: Patient identified, Emergency Drugs available, Suction available and Patient being monitored Patient Re-evaluated:Patient Re-evaluated prior to induction Oxygen Delivery Method: Circle System Utilized Preoxygenation: Pre-oxygenation with 100% oxygen Induction Type: IV induction Ventilation: Mask ventilation without difficulty Laryngoscope Size: Mac and 3 Grade View: Grade I Tube type: Oral Tube size: 7.0 mm Number of attempts: 1 Airway Equipment and Method: Stylet and Oral airway Placement Confirmation: ETT inserted through vocal cords under direct vision, positive ETCO2 and breath sounds checked- equal and bilateral Secured at: 21 cm Tube secured with: Tape Dental Injury: Teeth and Oropharynx as per pre-operative assessment

## 2023-11-16 NOTE — Transfer of Care (Signed)
 Immediate Anesthesia Transfer of Care Note  Patient: Cynthia Roberts  Procedure(s) Performed: SINUS SURGERY, ENDOSCOPIC (Bilateral)  Patient Location: PACU  Anesthesia Type:General  Level of Consciousness: awake, alert , and oriented  Airway & Oxygen Therapy: Patient Spontanous Breathing and Patient connected to nasal cannula oxygen  Post-op Assessment: Report given to RN and Post -op Vital signs reviewed and stable  Post vital signs: Reviewed and stable  Last Vitals:  Vitals Value Taken Time  BP 138/65 11/16/23 12:45  Temp 36.6 C 11/16/23 12:45  Pulse 80 11/16/23 12:49  Resp 11 11/16/23 12:49  SpO2 93 % 11/16/23 12:49  Vitals shown include unfiled device data.  Last Pain:  Vitals:   11/16/23 1245  TempSrc:   PainSc: 7          Complications: No notable events documented.

## 2023-11-16 NOTE — Anesthesia Preprocedure Evaluation (Signed)
 Anesthesia Evaluation  Patient identified by MRN, date of birth, ID band Patient awake    Reviewed: Allergy & Precautions, H&P , NPO status , Patient's Chart, lab work & pertinent test results  History of Anesthesia Complications (+) PONV and history of anesthetic complications  Airway Mallampati: II   Neck ROM: full    Dental   Pulmonary neg pulmonary ROS   breath sounds clear to auscultation       Cardiovascular hypertension,  Rhythm:regular Rate:Normal     Neuro/Psych  Headaches PSYCHIATRIC DISORDERS Anxiety Depression       GI/Hepatic hiatal hernia,,,  Endo/Other    Renal/GU      Musculoskeletal  (+) Arthritis ,    Abdominal   Peds  Hematology   Anesthesia Other Findings   Reproductive/Obstetrics                              Anesthesia Physical Anesthesia Plan  ASA: 2  Anesthesia Plan: General   Post-op Pain Management:    Induction: Intravenous  PONV Risk Score and Plan: 4 or greater and Ondansetron , Dexamethasone , Midazolam , Scopolamine  patch - Pre-op, Treatment may vary due to age or medical condition, TIVA and Propofol  infusion  Airway Management Planned: Oral ETT  Additional Equipment:   Intra-op Plan:   Post-operative Plan: Extubation in OR  Informed Consent: I have reviewed the patients History and Physical, chart, labs and discussed the procedure including the risks, benefits and alternatives for the proposed anesthesia with the patient or authorized representative who has indicated his/her understanding and acceptance.     Dental advisory given  Plan Discussed with: CRNA, Anesthesiologist and Surgeon  Anesthesia Plan Comments:         Anesthesia Quick Evaluation

## 2023-11-16 NOTE — H&P (Signed)
 Cynthia Roberts is an 58 y.o. female.    Chief Complaint:  Right sinonasal mass  HPI: Patient presents today for planned elective procedure.  He/she denies any interval change in history since office visit on 10/31/23.   Past Medical History:  Diagnosis Date   Anxiety    Arthritis    Depression    Hiatal hernia    Hypertension    Migraines    Mitral regurgitation    Osteoporosis    Ovarian cyst    left   PONV (postoperative nausea and vomiting)     Past Surgical History:  Procedure Laterality Date   BUNIONECTOMY Left    COLONOSCOPY     ESOPHAGOGASTRODUODENOSCOPY     KNEE ARTHROCENTESIS Left    LAPAROSCOPIC SALPINGO OOPHERECTOMY Bilateral 09/03/2023   Procedure: SALPINGO-OOPHORECTOMY, LAPAROSCOPIC;  Surgeon: Laurence Slater PARAS, MD;  Location: [facility] OR;  Service: Gynecology;  Laterality: Bilateral;    Family History  Problem Relation Age of Onset   Hypertension Mother    Asthma Mother    Stroke Father    Hypertension Father    Parkinson's disease Brother    Uterine cancer Maternal Grandmother        mets to lung   Hypertension Maternal Grandmother    Lung cancer Maternal Grandmother    Liver cancer Maternal Grandfather    Stomach cancer Maternal Grandfather    Diabetes Maternal Grandfather    Parkinson's disease Paternal Grandmother    Heart failure Paternal Grandmother    Heart attack Paternal Grandfather    Pancreatic cancer Cousin    Colon cancer Cousin    Breast cancer Neg Hx     Social History:  reports that she has never smoked. She has never used smokeless tobacco. She reports that she does not drink alcohol and does not use drugs.  Allergies:  Allergies  Allergen Reactions   Cefdinir Itching    Other reaction(s): itching   Celebrex [Celecoxib] Nausea Only and Other (See Comments)    Stomach pain   Mobic  [Meloxicam ] Nausea Only and Other (See Comments)    Stomach pain   Norvasc [Amlodipine] Other (See Comments)    Other reaction(s): headache headcaches      Medications Prior to Admission  Medication Sig Dispense Refill   acetaminophen  (TYLENOL ) 500 MG tablet Take 1,000 mg by mouth every 6 (six) hours as needed for moderate pain or headache.     brexpiprazole (REXULTI) 1 MG TABS tablet Take 1 mg by mouth in the morning.     chlorthalidone (HYGROTON) 25 MG tablet Take 25 mg by mouth in the morning.     clonazePAM (KLONOPIN) 1 MG tablet Take 1 mg by mouth at bedtime.     diltiazem (TIAZAC) 240 MG 24 hr capsule Take 240 mg by mouth at bedtime.     doxazosin (CARDURA) 2 MG tablet Take 2 mg by mouth at bedtime.     Esketamine HCl, 56 MG Dose, (SPRAVATO, 56 MG DOSE,) 28 MG/DEVICE SOPK Place into the nose daily as needed (Depression).     irbesartan (AVAPRO) 300 MG tablet Take 300 mg by mouth in the morning.     L-Methylfolate 15 MG TABS Take 15 mg by mouth in the morning.     lamoTRIgine (LAMICTAL) 150 MG tablet Take 300 mg by mouth at bedtime.     ondansetron  (ZOFRAN -ODT) 4 MG disintegrating tablet Take 1 tablet (4 mg total) by mouth every 8 (eight) hours as needed for nausea or vomiting. 20 tablet 0  pantoprazole  (PROTONIX ) 40 MG tablet Take 1 tablet (40 mg total) by mouth 2 (two) times daily. (Patient taking differently: Take 40 mg by mouth daily as needed (reflux).) 180 tablet 1   polyethylene glycol (MIRALAX  / GLYCOLAX ) 17 g packet Take 17 g by mouth daily as needed for mild constipation or moderate constipation.     potassium chloride  20 MEQ/15ML (10%) SOLN Take 20 mEq by mouth at bedtime.     rosuvastatin (CRESTOR) 20 MG tablet Take 20 mg by mouth at bedtime.     traZODone (DESYREL) 150 MG tablet Take 150 mg by mouth at bedtime.     vortioxetine HBr (TRINTELLIX) 20 MG TABS tablet Take 20 mg by mouth in the morning.     Baclofen 5 MG TABS Take 1/2-1 tablet nightly during headache flare and can take additional 1/2-1 tablet in the morning for breakthrough headache if needed     dicyclomine  (BENTYL ) 10 MG capsule Take 1 capsule (10 mg total) by  mouth 3 (three) times daily as needed for spasms. 90 capsule 0   ibandronate  (BONIVA ) 150 MG tablet Take 150 mg by mouth every 3 (three) months. Take in the morning with a full glass of water, on an empty stomach, and do not take anything else by mouth or lie down for the next 30 min.     OnabotulinumtoxinA (BOTOX IJ) Inject 1 Dose as directed every 3 (three) months.      No results found for this or any previous visit (from the past 48 hours). No results found.  ROS: negative other than stated in HPI  Blood pressure (!) 144/76, pulse 70, temperature 98.2 F (36.8 C), temperature source Oral, resp. rate 18, height 5' 5 (1.651 m), weight 68 kg, last menstrual period 10/04/2014, SpO2 95%.  PHYSICAL EXAM: General: Resting comfortably in NAD  Lungs: Non-labored respiratinos  Studies Reviewed: MRI orbit/face neck with contrast 10/23/23  IMPRESSION: No acute intracranial abnormality.  No abnormal enhancement.   Patent intracranial arterial vasculature.  No high-grade stenosis.   Patent arterial vasculature in the neck. Similar irregularity and beaded appearance of the mid and distal cervical ICAs, left greater than right. Findings suggestive of fibromuscular dysplasia.   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   1.8 cm enhancing mass within the right aspect of the nasal cavity. Recommend correlation with direct visualization. Consider ENT consultation.     Electronically Signed   By: Donnice Mania M.D.   On: 10/23/2023 19:29   Assessment/Plan Right sinonasal mass  Proceed with rigid nasal endoscopy with biopsies.     Electronically signed by:  Elspeth Coddington, MD  Staff Physician Facial Plastic & Reconstructive Surgery Otolaryngology - Head and Neck Surgery [facility]   11/16/2023, 11:05 AM

## 2023-11-19 ENCOUNTER — Encounter (HOSPITAL_COMMUNITY): Payer: Self-pay | Admitting: Otolaryngology

## 2023-11-19 LAB — SURGICAL PATHOLOGY

## 2023-11-20 NOTE — Anesthesia Postprocedure Evaluation (Signed)
 Anesthesia Post Note  Patient: Cynthia Roberts  Procedure(s) Performed: SINUS SURGERY, ENDOSCOPIC (Bilateral)     Patient location during evaluation: PACU Anesthesia Type: General Level of consciousness: awake and alert Pain management: pain level controlled Vital Signs Assessment: post-procedure vital signs reviewed and stable Respiratory status: spontaneous breathing, nonlabored ventilation, respiratory function stable and patient connected to nasal cannula oxygen Cardiovascular status: blood pressure returned to baseline and stable Postop Assessment: no apparent nausea or vomiting Anesthetic complications: no   There were no known notable events for this encounter.  Last Vitals:  Vitals:   11/16/23 1300 11/16/23 1315  BP: 132/71 137/77  Pulse: 80 71  Resp: 11 14  Temp:  36.6 C  SpO2: 96% 93%    Last Pain:  Vitals:   11/16/23 1245  TempSrc:   PainSc: 7                  [name]

## 2023-11-21 ENCOUNTER — Encounter (HOSPITAL_COMMUNITY)

## 2023-11-29 ENCOUNTER — Ambulatory Visit: Admitting: Cardiology

## 2023-12-26 ENCOUNTER — Other Ambulatory Visit (HOSPITAL_COMMUNITY): Payer: Self-pay | Admitting: Internal Medicine

## 2023-12-26 ENCOUNTER — Telehealth (HOSPITAL_COMMUNITY): Payer: Self-pay

## 2023-12-26 DIAGNOSIS — M810 Age-related osteoporosis without current pathological fracture: Secondary | ICD-10-CM | POA: Insufficient documentation

## 2023-12-26 NOTE — Telephone Encounter (Signed)
 Auth Submission: NO AUTH NEEDED Site of care: Site of care: [facility] Payer: [employer] Medication & CPT/J Code(s) submitted: Ibandronate  (J1740) Diagnosis Code: M81.0 Route of submission (phone, fax, portal):  Phone # Fax # Auth type: Buy/Bill HB Units/visits requested: 3mg  q58months Reference number:  Approval from: 12/26/23 to 03/26/24

## 2024-01-20 NOTE — Progress Notes (Unsigned)
 Cardiology Office Note:    Date:  01/21/2024   ID:  Cynthia Roberts, DOB 1965/10/07, MRN [id number]  PCP:  Shayne Anes, MD  Cardiologist:  Lonni LITTIE Nanas, MD  Electrophysiologist:  None   Referring MD: Shayne Anes, MD   Chief Complaint  Patient presents with   Chest Pain    History of Present Illness:    Cynthia Roberts is a 58 y.o. female with a hx of mitral regurgitation, hypertension who presents for follow-up.  She was referred by Dr. Shayne for evaluation of mitral regurgitation and hypertension, initially seen 01/11/2023.  Previously followed with Dr. Michele, last seen 11/13/2022.  Ram 11/2019 showed normal LV function, moderate MR, moderate TR.  Exercise Myoview 11/2019 showed good exercise capacity (10.1 METS), normal perfusion, EF 69%.  Echocardiogram 12/2019 showed normal LV function, mild MR and mild TR.  Coronary CTA 01/2021 showed normal coronary arteries, calcium score 0.  Zio patch x 14 days 12/2022 showed no significant arrhythmias.  Echocardiogram 02/19/2023 showed normal biventricular function, moderate left atrial enlargement, mild mitral regurgitation.  Since last clinic visit, she reports she is doing okay.  Continues to have some chest tightness but improves with inhaler use.  Denies any exertional chest pain.  Denies any dyspnea, lightheadedness, syncope, lower extremity edema. Rare palpitations.  She walks at least a couple of days per week for an hour, denies any exertional symptoms.    Past Medical History:  Diagnosis Date   Anxiety    Arthritis    Depression    Hiatal hernia    Hypertension    Migraines    Mitral regurgitation    Osteoporosis    Ovarian cyst    left   PONV (postoperative nausea and vomiting)     Past Surgical History:  Procedure Laterality Date   BUNIONECTOMY Left    COLONOSCOPY     ESOPHAGOGASTRODUODENOSCOPY     KNEE ARTHROCENTESIS Left    LAPAROSCOPIC SALPINGO OOPHERECTOMY Bilateral 09/03/2023   Procedure:  SALPINGO-OOPHORECTOMY, LAPAROSCOPIC;  Surgeon: Laurence Slater PARAS, MD;  Location: [facility] OR;  Service: Gynecology;  Laterality: Bilateral;   NASAL SINUS SURGERY Bilateral 11/16/2023   Procedure: SINUS SURGERY, ENDOSCOPIC;  Surgeon: Luciano Standing, MD;  Location: [facility] OR;  Service: ENT;  Laterality: Bilateral;  RIGID NASAL ENDOSCOPY WITH BIOPSY OF RIGHT NASAL CAVITY MASS    Current Medications: Current Meds  Medication Sig   acetaminophen  (TYLENOL ) 500 MG tablet Take 1,000 mg by mouth every 6 (six) hours as needed for moderate pain or headache.   AJOVY 225 MG/1.5ML SOAJ ADMINISTER 1.5 ML UNDER THE SKIN EVERY 28 DAYS   Baclofen 5 MG TABS Take 1/2-1 tablet nightly during headache flare and can take additional 1/2-1 tablet in the morning for breakthrough headache if needed   brexpiprazole (REXULTI) 1 MG TABS tablet Take 1 mg by mouth in the morning.   chlorthalidone (HYGROTON) 25 MG tablet Take 25 mg by mouth in the morning.   clonazePAM (KLONOPIN) 1 MG tablet Take 1 mg by mouth at bedtime.   dicyclomine  (BENTYL ) 10 MG capsule Take 1 capsule (10 mg total) by mouth 3 (three) times daily as needed for spasms.   diltiazem (TIAZAC) 240 MG 24 hr capsule Take 240 mg by mouth at bedtime.   doxazosin (CARDURA) 2 MG tablet Take 2 mg by mouth at bedtime.   Esketamine HCl, 56 MG Dose, (SPRAVATO, 56 MG DOSE,) 28 MG/DEVICE SOPK Place into the nose daily as needed (Depression).   ibandronate  (BONIVA ) 150  MG tablet Take 150 mg by mouth every 3 (three) months. Take in the morning with a full glass of water, on an empty stomach, and do not take anything else by mouth or lie down for the next 30 min.   irbesartan (AVAPRO) 300 MG tablet Take 300 mg by mouth in the morning.   L-Methylfolate 15 MG TABS Take 15 mg by mouth in the morning.   lamoTRIgine (LAMICTAL) 150 MG tablet Take 300 mg by mouth at bedtime.   OnabotulinumtoxinA (BOTOX IJ) Inject 1 Dose as directed every 3 (three) months.   ondansetron  (ZOFRAN -ODT) 4 MG  disintegrating tablet Take 1 tablet (4 mg total) by mouth every 8 (eight) hours as needed for nausea or vomiting.   pantoprazole  (PROTONIX ) 40 MG tablet Take 1 tablet (40 mg total) by mouth 2 (two) times daily. (Patient taking differently: Take 40 mg by mouth daily as needed (reflux).)   polyethylene glycol (MIRALAX  / GLYCOLAX ) 17 g packet Take 17 g by mouth daily as needed for mild constipation or moderate constipation.   potassium chloride  20 MEQ/15ML (10%) SOLN Take 20 mEq by mouth at bedtime.   rosuvastatin (CRESTOR) 20 MG tablet Take 20 mg by mouth at bedtime.   traZODone (DESYREL) 150 MG tablet Take 150 mg by mouth at bedtime.   Ubrogepant 100 MG TABS Take 100 mg by mouth.   vortioxetine HBr (TRINTELLIX) 20 MG TABS tablet Take 20 mg by mouth in the morning.     Allergies:   Cefdinir, Celebrex [celecoxib], Mobic  [meloxicam ], and Norvasc [amlodipine]   Social History   Socioeconomic History   Marital status: Single    Spouse name: Not on file   Number of children: 0   Years of education: Not on file   Highest education level: Not on file  Occupational History   Occupation: retired runner, broadcasting/film/video  Tobacco Use   Smoking status: Never   Smokeless tobacco: Never  Vaping Use   Vaping status: Never Used  Substance and Sexual Activity   Alcohol use: No   Drug use: No   Sexual activity: Not on file  Other Topics Concern   Not on file  Social History Narrative   Not on file   Social Drivers of Health   Financial Resource Strain: Not on file  Food Insecurity: Not on file  Transportation Needs: Not on file  Physical Activity: Not on file  Stress: Not on file  Social Connections: Not on file     Family History: The patient's family history includes Asthma in her mother; Colon cancer in her cousin; Diabetes in her maternal grandfather; Heart attack in her paternal grandfather; Heart failure in her paternal grandmother; Hypertension in her father, maternal grandmother, and mother; Liver  cancer in her maternal grandfather; Lung cancer in her maternal grandmother; Pancreatic cancer in her cousin; Parkinson's disease in her brother and paternal grandmother; Stomach cancer in her maternal grandfather; Stroke in her father; Uterine cancer in her maternal grandmother. There is no history of Breast cancer.  ROS:   Please see the history of present illness.     All other systems reviewed and are negative.  EKGs/Labs/Other Studies Reviewed:    The following studies were reviewed today:   EKG:   01/11/23: Normal sinus rhythm, rate 63, left axis deviation, LVH 01/21/2024: Normal sinus rhythm, rate 63, nonspecific T wave flattening, left axis deviation   Recent Labs: 09/03/2023: BUN 14; Creatinine, Ser 0.94; Hemoglobin 12.6; Platelets 201; Potassium 4.0; Sodium 139  Recent Lipid  Panel No results found for: CHOL, TRIG, HDL, CHOLHDL, VLDL, LDLCALC, LDLDIRECT  Physical Exam:    VS:  BP 122/72 (BP Location: Left Arm, Patient Position: Sitting, Cuff Size: Normal)   Pulse 63   Ht 5' 5 (1.651 m)   Wt 147 lb (66.7 kg)   LMP 10/04/2014   SpO2 94%   BMI 24.46 kg/m     Wt Readings from Last 3 Encounters:  01/21/24 147 lb (66.7 kg)  11/16/23 150 lb (68 kg)  09/03/23 154 lb (69.9 kg)     GEN:  Well nourished, well developed in no acute distress HEENT: Normal NECK: No JVD; No carotid bruits LYMPHATICS: No lymphadenopathy CARDIAC: RRR, no murmurs, rubs, gallops RESPIRATORY:  Clear to auscultation without rales, wheezing or rhonchi  ABDOMEN: Soft, non-tender, non-distended MUSCULOSKELETAL:  No edema; No deformity  SKIN: Warm and dry NEUROLOGIC:  Alert and oriented x 3 PSYCHIATRIC:  Normal affect   ASSESSMENT:    1. Essential hypertension   2. Palpitations   3. Hyperlipidemia, unspecified hyperlipidemia type   4. Precordial pain      PLAN:    Palpitations: Zio patch x 14 days 12/2022 showed no significant arrhythmias.  Echocardiogram 02/19/2023 showed  normal biventricular function, moderate left atrial enlargement, mild mitral regurgitation. - Reports no recent palpitations  Chest pain: Coronary CTA 01/2021 showed normal coronary arteries, calcium score 0. - Denies any recent exertional chest pain.  Reports chest tightness that improves with inhaler use, suspect related to her asthma  Mitral regurgitation: Moderate on echocardiogram 11/2019 at [place], repeat echo 1 month later at [place] reported as mild MR. Mild mitral regurgitation on echo 01/2023  Hypertension: On chlorthalidone 25 mg daily, diltiazem 240 mg daily, doxazosin 2 mg nightly, irbesartan 300 mg nightly -BP appears controlled but concern for secondary causes given requiring multiple antihypertensives.  Has known FMD of carotid arteries, underwent renal artery duplex 11/2022 which showed no renal artery stenosis.  Has had very low potassium (down to 2.7) on prior labs, chlorthalidone use likely contributing, but need to rule out hyperaldosteronism.  Renin/aldosterone and serum metanephrines unremarkable 12/2022.   - Check BMET, magnesium   FMD: Noted in carotid arteries, no significant stenosis.  Follows with neurology at [facility].  Hyperlipidemia: On rosuvastatin 20 mg daily  RTC in 1 year    Medication Adjustments/Labs and Tests Ordered: Current medicines are reviewed at length with the patient today.  Concerns regarding medicines are outlined above.  Orders Placed This Encounter  Procedures   Basic Metabolic Panel (BMET)   Magnesium    EKG 12-Lead   No orders of the defined types were placed in this encounter.   Patient Instructions  Medication Instructions:  Your physician recommends that you continue on your current medications as directed. Please refer to the Current Medication list given to you today.  *If you need a refill on your cardiac medications before your next appointment, please call your pharmacy*  Lab Work: Bmet, mg today If you have labs (blood  work) drawn today and your tests are completely normal, you will receive your results only by: MyChart Message (if you have MyChart) OR A paper copy in the mail If you have any lab test that is abnormal or we need to change your treatment, we will call you to review the results.  Testing/Procedures: none  Follow-Up: At [facility], you and your health needs are our priority.  As part of our continuing mission to provide you with exceptional heart care, our providers  are all part of one team.  This team includes your primary Cardiologist (physician) and Advanced Practice Providers or APPs (Physician Assistants and Nurse Practitioners) who all work together to provide you with the care you need, when you need it.  Your next appointment:   1 year  Provider:   Dr. Kate   We recommend signing up for the patient portal called MyChart.  Sign up information is provided on this After Visit Summary.  MyChart is used to connect with patients for Virtual Visits (Telemedicine).  Patients are able to view lab/test results, encounter notes, upcoming appointments, etc.  Non-urgent messages can be sent to your provider as well.   To learn more about what you can do with MyChart, go to [email or web address].   Other Instructions none           Signed, Lonni LITTIE Kate, MD  01/21/2024 9:23 AM    [facility]

## 2024-01-21 ENCOUNTER — Ambulatory Visit: Attending: Cardiology | Admitting: Cardiology

## 2024-01-21 VITALS — BP 122/72 | HR 63 | Ht 65.0 in | Wt 147.0 lb

## 2024-01-21 DIAGNOSIS — R072 Precordial pain: Secondary | ICD-10-CM | POA: Diagnosis not present

## 2024-01-21 DIAGNOSIS — E785 Hyperlipidemia, unspecified: Secondary | ICD-10-CM

## 2024-01-21 DIAGNOSIS — R002 Palpitations: Secondary | ICD-10-CM

## 2024-01-21 DIAGNOSIS — I10 Essential (primary) hypertension: Secondary | ICD-10-CM | POA: Diagnosis not present

## 2024-01-21 NOTE — Patient Instructions (Signed)
 Medication Instructions:  Your physician recommends that you continue on your current medications as directed. Please refer to the Current Medication list given to you today.  *If you need a refill on your cardiac medications before your next appointment, please call your pharmacy*  Lab Work: Bmet, mg today If you have labs (blood work) drawn today and your tests are completely normal, you will receive your results only by: MyChart Message (if you have MyChart) OR A paper copy in the mail If you have any lab test that is abnormal or we need to change your treatment, we will call you to review the results.  Testing/Procedures: none  Follow-Up: At [facility], you and your health needs are our priority.  As part of our continuing mission to provide you with exceptional heart care, our providers are all part of one team.  This team includes your primary Cardiologist (physician) and Advanced Practice Providers or APPs (Physician Assistants and Nurse Practitioners) who all work together to provide you with the care you need, when you need it.  Your next appointment:   1 year  Provider:   Dr. Kate   We recommend signing up for the patient portal called MyChart.  Sign up information is provided on this After Visit Summary.  MyChart is used to connect with patients for Virtual Visits (Telemedicine).  Patients are able to view lab/test results, encounter notes, upcoming appointments, etc.  Non-urgent messages can be sent to your provider as well.   To learn more about what you can do with MyChart, go to [email or web address].   Other Instructions none

## 2024-01-22 ENCOUNTER — Ambulatory Visit: Payer: Self-pay | Admitting: Cardiology

## 2024-01-22 LAB — BASIC METABOLIC PANEL WITH GFR
BUN/Creatinine Ratio: 18 (ref 9–23)
BUN: 15 mg/dL (ref 6–24)
CO2: 27 mmol/L (ref 20–29)
Calcium: 10.1 mg/dL (ref 8.7–10.2)
Chloride: 97 mmol/L (ref 96–106)
Creatinine, Ser: 0.82 mg/dL (ref 0.57–1.00)
Glucose: 108 mg/dL — ABNORMAL HIGH (ref 70–99)
Potassium: 3.9 mmol/L (ref 3.5–5.2)
Sodium: 140 mmol/L (ref 134–144)
eGFR: 83 mL/min/1.73 (ref >59)

## 2024-01-22 LAB — MAGNESIUM: Magnesium: 2 mg/dL (ref 1.6–2.3)

## 2024-01-30 ENCOUNTER — Ambulatory Visit (HOSPITAL_COMMUNITY)
Admission: RE | Admit: 2024-01-30 | Discharge: 2024-01-30 | Disposition: A | Discharge status: Home / Self Care | Source: Ambulatory Visit | Attending: Internal Medicine | Admitting: Internal Medicine

## 2024-01-30 VITALS — BP 126/79 | HR 71 | Temp 98.3°F | Resp 16

## 2024-01-30 DIAGNOSIS — M810 Age-related osteoporosis without current pathological fracture: Secondary | ICD-10-CM | POA: Diagnosis present

## 2024-01-30 MED ORDER — DIPHENHYDRAMINE HCL 25 MG PO CAPS
25.0000 mg | ORAL_CAPSULE | Freq: Once | ORAL | Status: AC
  Administered 2024-01-30: 25 mg via ORAL

## 2024-01-30 MED ORDER — DIPHENHYDRAMINE HCL 25 MG PO CAPS
ORAL_CAPSULE | ORAL | Status: AC
  Filled 2024-01-30: qty 1

## 2024-01-30 MED ORDER — ACETAMINOPHEN 325 MG PO TABS
ORAL_TABLET | ORAL | Status: AC
  Filled 2024-01-30: qty 2

## 2024-01-30 MED ORDER — ACETAMINOPHEN 325 MG PO TABS
650.0000 mg | ORAL_TABLET | Freq: Once | ORAL | Status: AC
  Administered 2024-01-30: 650 mg via ORAL

## 2024-01-30 MED ORDER — IBANDRONATE SODIUM 3 MG/3ML IV SOLN
3.0000 mg | Freq: Once | INTRAVENOUS | Status: AC
  Administered 2024-01-30: 3 mg via INTRAVENOUS
  Filled 2024-01-30: qty 3

## 2024-02-12 ENCOUNTER — Inpatient Hospital Stay (HOSPITAL_COMMUNITY): Admission: RE | Admit: 2024-02-12 | Source: Ambulatory Visit

## 2024-02-18 ENCOUNTER — Telehealth: Payer: Self-pay | Admitting: Cardiology

## 2024-02-18 DIAGNOSIS — R002 Palpitations: Secondary | ICD-10-CM

## 2024-02-18 NOTE — Telephone Encounter (Signed)
  Patient c/o Palpitations:  STAT if patient reporting lightheadedness, shortness of breath, or chest pain  How long have you had palpitations/irregular HR/ Afib? Are you having the symptoms now? No   Are you currently experiencing lightheadedness, SOB or CP? No   Do you have a history of afib (atrial fibrillation) or irregular heart rhythm? No   Have you checked your BP or HR? (document readings if available): HR 100 at rest  Are you experiencing any other symptoms? None    The patient said that for the past few days she has been feeling that her heart rate is elevated, even at rest. This morning, while checking her blood pressure, her BP monitor indicated an irregular heart rhythm. She is requesting to see Dr. Kate as soon as possible. She added that she has been taking an antidepressant and is unsure if it is causing the symptoms, but she noted that she was already feeling her heart beating fast even before starting the medication.

## 2024-02-18 NOTE — Telephone Encounter (Signed)
 Pt reports HRs averaging 69-79 bpm, but caught HR at 100 at rest this morning.  BP monitor is saying she has an irregular rhythm. She is feeling fluttering, like a flip flop of the heart beat. Denies CP, SOB, dizziness/light headedness, wt gain/edema. Pt aware that she will most likely need to wear anther monitor to determine what the irregularity is, prior to seeing her for an office visit. Will forward to MD for review/advisement. Pt understands office will call once MD has advised (Forward response to triage)

## 2024-02-19 ENCOUNTER — Ambulatory Visit: Attending: Cardiology

## 2024-02-19 DIAGNOSIS — R002 Palpitations: Secondary | ICD-10-CM

## 2024-02-19 NOTE — Telephone Encounter (Signed)
 Recommend Zio patch x 7 days for palpitations.  Can we add her to my schedule on 12/29?

## 2024-02-19 NOTE — Telephone Encounter (Signed)
 Spoke with patient regarding Dr. Kate instructions. Ordered Zio monitor and reviewed instructions with patient. Pt verbalized understanding. Message forwarded to scheduling team, MD and nurse for scheduling follow-up appt on 12/29. Pt aware that someone will reach out to her to schedule this appointment.

## 2024-02-19 NOTE — Progress Notes (Unsigned)
 Enrolled patient for a 7 day Zio XT monitor to be mailed to patients home.

## 2024-02-19 NOTE — Telephone Encounter (Signed)
 It looks like there are openings on 1/5

## 2024-02-19 NOTE — Telephone Encounter (Signed)
 I am at [facility] that day- please add to schedule at [facility]

## 2024-02-19 NOTE — Telephone Encounter (Signed)
 Spoke with patient regarding appt time requested. Pt is going out of town 12/22-12/29 (arriving late at night-per patient). Will re-send to Dr and scheduling team for preference of sooner date or first available. Pt verbalizes understanding that someone will be in touch to get this scheduled.

## 2024-02-19 NOTE — Telephone Encounter (Signed)
 Unable to leave message for pt. Calling to schedule with Dr. Kate at [facility] for 12/29.  Will send a mychart message.

## 2024-02-20 NOTE — Telephone Encounter (Signed)
 Attempted to call patient for appt open on 12/1. Left message to call back.

## 2024-02-25 NOTE — Telephone Encounter (Signed)
 Spoke to patient and appt scheduled for 1/5 @10 :00. Patient verbalized an understanding

## 2024-03-12 ENCOUNTER — Other Ambulatory Visit: Payer: Self-pay | Admitting: Internal Medicine

## 2024-03-21 ENCOUNTER — Ambulatory Visit: Payer: Self-pay | Admitting: Cardiology

## 2024-03-21 DIAGNOSIS — R002 Palpitations: Secondary | ICD-10-CM

## 2024-03-30 NOTE — Progress Notes (Unsigned)
 " Cardiology Office Note:    Date:  03/31/2024   ID:  Cynthia Roberts, DOB 04/13/1965, MRN [id number]  PCP:  Shayne Anes, MD  Cardiologist:  Lonni LITTIE Nanas, MD  Electrophysiologist:  None   Referring MD: Shayne Anes, MD   Chief Complaint  Patient presents with   Palpitations    History of Present Illness:    Cynthia Roberts is a 59 y.o. female with a hx of mitral regurgitation, hypertension who presents for follow-up.  She was referred by Dr. Shayne for evaluation of mitral regurgitation and hypertension, initially seen 01/11/2023.  Previously followed with Dr. Michele, last seen 11/13/2022.  Ram 11/2019 showed normal LV function, moderate MR, moderate TR.  Exercise Myoview 11/2019 showed good exercise capacity (10.1 METS), normal perfusion, EF 69%.  Echocardiogram 12/2019 showed normal LV function, mild MR and mild TR.  Coronary CTA 01/2021 showed normal coronary arteries, calcium score 0.  Zio patch x 14 days 12/2022 showed no significant arrhythmias.  Echocardiogram 02/19/2023 showed normal biventricular function, moderate left atrial enlargement, mild mitral regurgitation.  Zio patch x 7 days 02/2024 showed no significant arrhythmias.  Since last clinic visit, she reports she is doing well.  Reports palpitations about once per week for 30 minutes to an hour.  She continues to have intermittent chest pain.  She walks 30 minutes 4 times per week, denies any exertional chest pain.  She denies any dyspnea.  Does report some lightheadedness but no syncope.  She denies any lower extremity edema.   Past Medical History:  Diagnosis Date   Anxiety    Arthritis    Depression    Hiatal hernia    Hypertension    Migraines    Mitral regurgitation    Osteoporosis    Ovarian cyst    left   PONV (postoperative nausea and vomiting)     Past Surgical History:  Procedure Laterality Date   BUNIONECTOMY Left    COLONOSCOPY     ESOPHAGOGASTRODUODENOSCOPY     KNEE ARTHROCENTESIS Left     LAPAROSCOPIC SALPINGO OOPHERECTOMY Bilateral 09/03/2023   Procedure: SALPINGO-OOPHORECTOMY, LAPAROSCOPIC;  Surgeon: Laurence Slater PARAS, MD;  Location: [facility] OR;  Service: Gynecology;  Laterality: Bilateral;   NASAL SINUS SURGERY Bilateral 11/16/2023   Procedure: SINUS SURGERY, ENDOSCOPIC;  Surgeon: Luciano Standing, MD;  Location: [facility] OR;  Service: ENT;  Laterality: Bilateral;  RIGID NASAL ENDOSCOPY WITH BIOPSY OF RIGHT NASAL CAVITY MASS    Current Medications: Current Meds  Medication Sig   acetaminophen  (TYLENOL ) 500 MG tablet Take 1,000 mg by mouth every 6 (six) hours as needed for moderate pain or headache.   AJOVY 225 MG/1.5ML SOAJ ADMINISTER 1.5 ML UNDER THE SKIN EVERY 28 DAYS   brexpiprazole (REXULTI) 1 MG TABS tablet Take 1 mg by mouth in the morning.   chlorthalidone (HYGROTON) 25 MG tablet Take 25 mg by mouth in the morning.   clonazePAM (KLONOPIN) 1 MG tablet Take 1 mg by mouth at bedtime.   diltiazem (TIAZAC) 240 MG 24 hr capsule Take 240 mg by mouth at bedtime.   doxazosin (CARDURA) 2 MG tablet Take 2 mg by mouth at bedtime.   DULoxetine (CYMBALTA) 30 MG capsule Take 30 mg by mouth 2 (two) times daily.   Esketamine HCl, 56 MG Dose, (SPRAVATO, 56 MG DOSE,) 28 MG/DEVICE SOPK Place into the nose daily as needed (Depression).   ibandronate  (BONIVA ) 150 MG tablet Take 150 mg by mouth every 3 (three) months. Take in the morning with  a full glass of water, on an empty stomach, and do not take anything else by mouth or lie down for the next 30 min.   irbesartan (AVAPRO) 300 MG tablet Take 300 mg by mouth in the morning.   L-Methylfolate 15 MG TABS Take 15 mg by mouth in the morning.   lamoTRIgine (LAMICTAL) 150 MG tablet Take 300 mg by mouth at bedtime.   OnabotulinumtoxinA (BOTOX IJ) Inject 1 Dose as directed every 3 (three) months.   ondansetron  (ZOFRAN -ODT) 4 MG disintegrating tablet Take 1 tablet (4 mg total) by mouth every 8 (eight) hours as needed for nausea or vomiting.   pantoprazole   (PROTONIX ) 40 MG tablet Take 1 tablet (40 mg total) by mouth daily as needed (reflux).   polyethylene glycol (MIRALAX  / GLYCOLAX ) 17 g packet Take 17 g by mouth daily as needed for mild constipation or moderate constipation.   potassium chloride  20 MEQ/15ML (10%) SOLN Take 20 mEq by mouth at bedtime.   rosuvastatin (CRESTOR) 20 MG tablet Take 20 mg by mouth at bedtime.   traZODone (DESYREL) 150 MG tablet Take 150 mg by mouth at bedtime.   vortioxetine HBr (TRINTELLIX) 20 MG TABS tablet Take 20 mg by mouth in the morning.     Allergies:   Cefdinir, Celebrex [celecoxib], Mobic  [meloxicam ], and Norvasc [amlodipine]   Social History   Socioeconomic History   Marital status: Single    Spouse name: Not on file   Number of children: 0   Years of education: Not on file   Highest education level: Not on file  Occupational History   Occupation: retired runner, broadcasting/film/video  Tobacco Use   Smoking status: Never   Smokeless tobacco: Never  Vaping Use   Vaping status: Never Used  Substance and Sexual Activity   Alcohol use: No   Drug use: No   Sexual activity: Not on file  Other Topics Concern   Not on file  Social History Narrative   Not on file   Social Drivers of Health   Tobacco Use: Low Risk  (01/02/2024)   Received from [facility]   Patient History    Smoking Tobacco Use: Never    Smokeless Tobacco Use: Never    Passive Exposure: Not on file  Financial Resource Strain: Not on file  Food Insecurity: Not on file  Transportation Needs: Not on file  Physical Activity: Not on file  Stress: Not on file  Social Connections: Not on file  Depression (EYV7-0): Not on file  Alcohol Screen: Not on file  Housing: Unknown (04/24/2023)   Received from [facility]   Epic    Unable to Pay for Housing in the Last Year: Not on file    Number of Times Moved in the Last Year: Not on file    At any time in the past 12 months, were you homeless or living in a shelter  (including now)?: No  Utilities: Not on file  Health Literacy: Not on file     Family History: The patient's family history includes Asthma in her mother; Colon cancer in her cousin; Diabetes in her maternal grandfather; Heart attack in her paternal grandfather; Heart failure in her paternal grandmother; Hypertension in her father, maternal grandmother, and mother; Liver cancer in her maternal grandfather; Lung cancer in her maternal grandmother; Pancreatic cancer in her cousin; Parkinson's disease in her brother and paternal grandmother; Stomach cancer in her maternal grandfather; Stroke in her father; Uterine cancer in her maternal grandmother.  There is no history of Breast cancer.  ROS:   Please see the history of present illness.     All other systems reviewed and are negative.  EKGs/Labs/Other Studies Reviewed:    The following studies were reviewed today:   EKG:   01/11/23: Normal sinus rhythm, rate 63, left axis deviation, LVH 01/21/2024: Normal sinus rhythm, rate 63, nonspecific T wave flattening, left axis deviation   Recent Labs: 09/03/2023: Hemoglobin 12.6; Platelets 201 01/21/2024: BUN 15; Creatinine, Ser 0.82; Magnesium  2.0; Potassium 3.9; Sodium 140  Recent Lipid Panel No results found for: CHOL, TRIG, HDL, CHOLHDL, VLDL, LDLCALC, LDLDIRECT  Physical Exam:    VS:  BP 112/62 (BP Location: Left Arm, Patient Position: Sitting, Cuff Size: Normal)   Pulse 72   Ht 5' 6 (1.676 m)   LMP 10/04/2014   SpO2 97%   BMI 23.73 kg/m     Wt Readings from Last 3 Encounters:  01/21/24 147 lb (66.7 kg)  11/16/23 150 lb (68 kg)  09/03/23 154 lb (69.9 kg)     GEN:  Well nourished, well developed in no acute distress HEENT: Normal NECK: No JVD; No carotid bruits LYMPHATICS: No lymphadenopathy CARDIAC: RRR, no murmurs, rubs, gallops RESPIRATORY:  Clear to auscultation without rales, wheezing or rhonchi  ABDOMEN: Soft, non-tender, non-distended MUSCULOSKELETAL:   No edema; No deformity  SKIN: Warm and dry NEUROLOGIC:  Alert and oriented x 3 PSYCHIATRIC:  Normal affect   ASSESSMENT:    1. Palpitations   2. Chest pain of uncertain etiology   3. Mitral valve insufficiency, unspecified etiology   4. Hyperlipidemia, unspecified hyperlipidemia type      PLAN:    Palpitations: Zio patch x 14 days 12/2022 showed no significant arrhythmias.  Echocardiogram 02/19/2023 showed normal biventricular function, moderate left atrial enlargement, mild mitral regurgitation.  Reported worsening palpitations and underwent Zio patch x 7 days 02/2024 showed no significant arrhythmias.  Chest pain: Coronary CTA 01/2021 showed normal coronary arteries, calcium score 0. - Denies any recent exertional chest pain.  Reports chest tightness that improves with inhaler use, suspect related to her asthma  Mitral regurgitation: Moderate on echocardiogram 11/2019 at [place], repeat echo 1 month later at [place] reported as mild MR. Mild mitral regurgitation on echo 01/2023  Hypertension: On chlorthalidone 25 mg daily, diltiazem 240 mg daily, doxazosin 2 mg nightly, irbesartan 300 mg nightly -BP appears controlled but concern for secondary causes given requiring multiple antihypertensives.  Has known FMD of carotid arteries, underwent renal artery duplex 11/2022 which showed no renal artery stenosis.  Has had very low potassium (down to 2.7) on prior labs, chlorthalidone use likely contributing, but need to rule out hyperaldosteronism.  Renin/aldosterone and serum metanephrines unremarkable 12/2022.   - Check BMET, magnesium , TSH  FMD: Noted in carotid arteries, no significant stenosis.  Follows with neurology at [facility].  Hyperlipidemia: On rosuvastatin 20 mg daily  RTC in 6 months    Medication Adjustments/Labs and Tests Ordered: Current medicines are reviewed at length with the patient today.  Concerns regarding medicines are outlined above.  Orders Placed This Encounter   Procedures   Basic Metabolic Panel (BMET)   Magnesium    TSH   No orders of the defined types were placed in this encounter.   Patient Instructions  Medication Instructions:  Your physician recommends that you continue on your current medications as directed. Please refer to the Current Medication list given to you today.  *If you need a refill on your cardiac medications before  your next appointment, please call your pharmacy*  Lab Work: BMP, Magnesium , TSH If you have labs (blood work) drawn today and your tests are completely normal, you will receive your results only by: MyChart Message (if you have MyChart) OR A paper copy in the mail If you have any lab test that is abnormal or we need to change your treatment, we will call you to review the results.  Testing/Procedures: None ordered  Follow-Up: At [facility], you and your health needs are our priority.  As part of our continuing mission to provide you with exceptional heart care, our providers are all part of one team.  This team includes your primary Cardiologist (physician) and Advanced Practice Providers or APPs (Physician Assistants and Nurse Practitioners) who all work together to provide you with the care you need, when you need it.  Your next appointment:   6 month(s)  Provider:   Lonni LITTIE Nanas, MD    We recommend signing up for the patient portal called MyChart.  Sign up information is provided on this After Visit Summary.  MyChart is used to connect with patients for Virtual Visits (Telemedicine).  Patients are able to view lab/test results, encounter notes, upcoming appointments, etc.  Non-urgent messages can be sent to your provider as well.   To learn more about what you can do with MyChart, go to [email or web address].              Signed, Lonni LITTIE Nanas, MD  03/31/2024 10:22 AM    [facility] "

## 2024-03-31 ENCOUNTER — Ambulatory Visit: Attending: Cardiology | Admitting: Cardiology

## 2024-03-31 VITALS — BP 112/62 | HR 72 | Ht 66.0 in

## 2024-03-31 DIAGNOSIS — E785 Hyperlipidemia, unspecified: Secondary | ICD-10-CM

## 2024-03-31 DIAGNOSIS — R079 Chest pain, unspecified: Secondary | ICD-10-CM | POA: Diagnosis not present

## 2024-03-31 DIAGNOSIS — R002 Palpitations: Secondary | ICD-10-CM

## 2024-03-31 DIAGNOSIS — I340 Nonrheumatic mitral (valve) insufficiency: Secondary | ICD-10-CM

## 2024-03-31 NOTE — Patient Instructions (Signed)
 Medication Instructions:  Your physician recommends that you continue on your current medications as directed. Please refer to the Current Medication list given to you today.  *If you need a refill on your cardiac medications before your next appointment, please call your pharmacy*  Lab Work: BMP, Magnesium , TSH If you have labs (blood work) drawn today and your tests are completely normal, you will receive your results only by: MyChart Message (if you have MyChart) OR A paper copy in the mail If you have any lab test that is abnormal or we need to change your treatment, we will call you to review the results.  Testing/Procedures: None ordered  Follow-Up: At [facility], you and your health needs are our priority.  As part of our continuing mission to provide you with exceptional heart care, our providers are all part of one team.  This team includes your primary Cardiologist (physician) and Advanced Practice Providers or APPs (Physician Assistants and Nurse Practitioners) who all work together to provide you with the care you need, when you need it.  Your next appointment:   6 month(s)  Provider:   Lonni LITTIE Nanas, MD    We recommend signing up for the patient portal called MyChart.  Sign up information is provided on this After Visit Summary.  MyChart is used to connect with patients for Virtual Visits (Telemedicine).  Patients are able to view lab/test results, encounter notes, upcoming appointments, etc.  Non-urgent messages can be sent to your provider as well.   To learn more about what you can do with MyChart, go to [email or web address].

## 2024-04-01 ENCOUNTER — Ambulatory Visit: Payer: Self-pay | Admitting: Cardiology

## 2024-04-01 LAB — MAGNESIUM: Magnesium: 2 mg/dL (ref 1.6–2.3)

## 2024-04-01 LAB — BASIC METABOLIC PANEL WITH GFR
BUN/Creatinine Ratio: 14 (ref 9–23)
BUN: 11 mg/dL (ref 6–24)
CO2: 28 mmol/L (ref 20–29)
Calcium: 9.6 mg/dL (ref 8.7–10.2)
Chloride: 97 mmol/L (ref 96–106)
Creatinine, Ser: 0.77 mg/dL (ref 0.57–1.00)
Glucose: 133 mg/dL — ABNORMAL HIGH (ref 70–99)
Potassium: 3.6 mmol/L (ref 3.5–5.2)
Sodium: 138 mmol/L (ref 134–144)
eGFR: 90 mL/min/1.73

## 2024-04-01 LAB — TSH: TSH: 2.14 u[IU]/mL (ref 0.450–4.500)

## 2024-04-10 ENCOUNTER — Telehealth (HOSPITAL_COMMUNITY): Payer: Self-pay

## 2024-04-10 NOTE — Telephone Encounter (Addendum)
 04/11/23 11:55AM: Sent email to Arland at [facility] to ask for new orders for alternate therapy to be faxed over, as boniva  is on back order. Awaiting response.  04/11/23 12:59PM: Received a response from Arland at [facility] : Well there is an issue with Delon Rushing and I have to call her.  Someone in here got her and her mothers DEXAs mixed up and Jennifers is actually normal so she will not need the IV Boniva  according to Dr. Shayne.  I am going to try to contact her today and explain what has happened.  Will inform infusion pharmacists to d/c plan.

## 2024-05-02 ENCOUNTER — Inpatient Hospital Stay (HOSPITAL_COMMUNITY): Admission: RE | Admit: 2024-05-02 | Source: Ambulatory Visit
